# Patient Record
Sex: Female | Born: 1985 | Race: Black or African American | Hispanic: No | Marital: Single | Smoking: Never smoker
Health system: Southern US, Community
[De-identification: ages and names within clinical notes are randomized; demographics above are authoritative.]

## PROBLEM LIST (undated history)

## (undated) DIAGNOSIS — G932 Benign intracranial hypertension: Secondary | ICD-10-CM

## (undated) DIAGNOSIS — M79671 Pain in right foot: Secondary | ICD-10-CM

## (undated) DIAGNOSIS — H4710 Unspecified papilledema: Secondary | ICD-10-CM

## (undated) HISTORY — DX: Benign intracranial hypertension: G93.2

## (undated) HISTORY — DX: Unspecified papilledema: H47.10

## (undated) HISTORY — DX: Pain in right foot: M79.671

## (undated) HISTORY — PX: WISDOM TOOTH EXTRACTION: SHX21

## (undated) HISTORY — PX: FOOT SURGERY: SHX648

---
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
## 2009-02-01 ENCOUNTER — Emergency Department (HOSPITAL_COMMUNITY): Admission: EM | Admit: 2009-02-01 | Discharge: 2009-02-01 | Payer: Self-pay | Admitting: Emergency Medicine

## 2009-10-19 ENCOUNTER — Emergency Department (HOSPITAL_COMMUNITY): Admission: EM | Admit: 2009-10-19 | Discharge: 2009-10-19 | Payer: Self-pay | Admitting: Family Medicine

## 2009-11-01 ENCOUNTER — Emergency Department (HOSPITAL_COMMUNITY): Admission: EM | Admit: 2009-11-01 | Discharge: 2009-11-01 | Payer: Self-pay | Admitting: Family Medicine

## 2009-12-17 ENCOUNTER — Emergency Department (HOSPITAL_COMMUNITY): Admission: EM | Admit: 2009-12-17 | Discharge: 2009-12-17 | Payer: Self-pay | Admitting: Family Medicine

## 2010-05-22 ENCOUNTER — Emergency Department (HOSPITAL_COMMUNITY): Admission: EM | Admit: 2010-05-22 | Discharge: 2009-10-23 | Payer: Self-pay | Admitting: Emergency Medicine

## 2010-09-20 LAB — URINE CULTURE: Colony Count: 3000

## 2010-09-20 LAB — POCT I-STAT, CHEM 8
BUN: 14 mg/dL (ref 6–23)
Chloride: 104 mEq/L (ref 96–112)
Creatinine, Ser: 0.8 mg/dL (ref 0.4–1.2)
Sodium: 140 mEq/L (ref 135–145)
TCO2: 24 mmol/L (ref 0–100)

## 2010-09-20 LAB — CBC
HCT: 41.7 % (ref 36.0–46.0)
Hemoglobin: 14.4 g/dL (ref 12.0–15.0)
Platelets: 235 10*3/uL (ref 150–400)
RDW: 13.2 % (ref 11.5–15.5)
WBC: 6.2 10*3/uL (ref 4.0–10.5)

## 2010-09-20 LAB — URINALYSIS, ROUTINE W REFLEX MICROSCOPIC
Bilirubin Urine: NEGATIVE
Hgb urine dipstick: NEGATIVE
Protein, ur: NEGATIVE mg/dL
Urobilinogen, UA: 1 mg/dL (ref 0.0–1.0)

## 2010-09-20 LAB — DIFFERENTIAL
Basophils Absolute: 0 10*3/uL (ref 0.0–0.1)
Eosinophils Relative: 0 % (ref 0–5)
Lymphocytes Relative: 8 % — ABNORMAL LOW (ref 12–46)
Lymphs Abs: 0.5 10*3/uL — ABNORMAL LOW (ref 0.7–4.0)
Neutro Abs: 5.4 10*3/uL (ref 1.7–7.7)

## 2010-09-20 LAB — POCT PREGNANCY, URINE: Preg Test, Ur: NEGATIVE

## 2011-03-30 ENCOUNTER — Emergency Department (HOSPITAL_COMMUNITY)
Admission: EM | Admit: 2011-03-30 | Discharge: 2011-03-30 | Disposition: A | Discharge status: Home / Self Care | Payer: Self-pay | Attending: Emergency Medicine | Admitting: Emergency Medicine

## 2011-03-30 DIAGNOSIS — R5381 Other malaise: Secondary | ICD-10-CM | POA: Insufficient documentation

## 2011-03-30 DIAGNOSIS — R42 Dizziness and giddiness: Secondary | ICD-10-CM | POA: Insufficient documentation

## 2011-03-30 DIAGNOSIS — R059 Cough, unspecified: Secondary | ICD-10-CM | POA: Insufficient documentation

## 2011-03-30 DIAGNOSIS — R05 Cough: Secondary | ICD-10-CM | POA: Insufficient documentation

## 2011-03-30 DIAGNOSIS — J069 Acute upper respiratory infection, unspecified: Secondary | ICD-10-CM | POA: Insufficient documentation

## 2011-03-30 DIAGNOSIS — R079 Chest pain, unspecified: Secondary | ICD-10-CM | POA: Insufficient documentation

## 2011-03-30 DIAGNOSIS — R071 Chest pain on breathing: Secondary | ICD-10-CM | POA: Insufficient documentation

## 2014-04-13 ENCOUNTER — Ambulatory Visit (INDEPENDENT_AMBULATORY_CARE_PROVIDER_SITE_OTHER): Payer: BC Managed Care – PPO

## 2014-04-13 ENCOUNTER — Encounter: Payer: Self-pay | Admitting: Podiatry

## 2014-04-13 ENCOUNTER — Ambulatory Visit (INDEPENDENT_AMBULATORY_CARE_PROVIDER_SITE_OTHER): Payer: BC Managed Care – PPO | Admitting: Podiatry

## 2014-04-13 VITALS — BP 123/76 | HR 88 | Resp 16

## 2014-04-13 DIAGNOSIS — M2142 Flat foot [pes planus] (acquired), left foot: Secondary | ICD-10-CM

## 2014-04-13 DIAGNOSIS — M722 Plantar fascial fibromatosis: Secondary | ICD-10-CM

## 2014-04-13 DIAGNOSIS — M2141 Flat foot [pes planus] (acquired), right foot: Secondary | ICD-10-CM

## 2014-04-13 NOTE — Progress Notes (Signed)
   Subjective:    Patient ID: Lisa Stafford, female    DOB: April 30, 1986, 28 y.o.   MRN: [id number]  HPI Comments: "I have these knots on the bottom"  Lisa Stafford, 28 year old female, presents to the office today with complaints of bilateral arch pain and painful knot on the bottom of her feet 1 year. She states over the last several months to 1 year did not increase in size. She states the pain is worse with pressure and ambulation. She's had no prior treatment. She also states that she has pain in her heels on the bottom particularly in the morning or after periods of activity. She denies any recent trauma or injury to the area. No other complaints at this time.     Review of Systems  Musculoskeletal: Positive for gait problem and myalgias.  All other systems reviewed and are negative.      Objective:   Physical Exam AAO x3, NAD DP/PT pulses palpable bilaterally, CRT less than 3 seconds Protective sensation intact with Simms Weinstein monofilament, vibratory sensation intact, Achilles tendon reflex intact Bilateral tenderness to the plantar medial tubercle of the calcaneus at the insertion of the plantar fascia. There is no pain with lateral compression of the calcaneus or with vibratory sensation. No pain along the posterior aspect of the calcaneus or along the course of the Achilles tendon. There is tenderness to palpation over areas of firm nodules within the plantar medial arch of the foot. On the right foot there are 2 lesions identified on the left foot there is one lesion identified. All are firm and appeared to be mobile and appeared to be within the plantar fascia. The overlying skin is intact without any changes. There is no edema, erythema, increased warmth over the area. There is no pinpoint bony tenderness or pain with vibratory sensation. Decrease in medial arch height bilaterally with bilateral equinus. MMT 5/5, ROM WNL No open lesions or pre-ulcerative lesions. No calf  pain, swelling, warmth, erythema       Assessment & Plan:  28 year old female with bilateral plantar fasciitis, bilateral plantar fibromas. -X-rays were obtained and reviewed with the patient. -Conservative versus surgical treatment discussed including alternatives, risks, complications. At this time the patient states that she cannot have any surgical intervention as she is going to be starting a new job in the next couple weeks. -Dispensed offloading pad to help take the pressure off of the plantar fibromas. Discussed with her that she would likely benefit from custom orthotics. She states that she will call make an appointment to get custom orthotics in the future. -For plantar fasciitis patient wishes to hold off on any kind of steroid injection at this time as well as for the plantar fibromas. Discussed with her stretching as well as icing exercises. Also discussed that she would likely benefit from a custom orthotic. Discussed proper shoe gear. -Follow up in 4 weeks. Call the office for any questions, concerns, change in symptoms. In the meantime call the office with any questions, concerns, change in symptoms.

## 2014-05-09 ENCOUNTER — Ambulatory Visit: Payer: BC Managed Care – PPO | Admitting: Podiatry

## 2014-05-26 ENCOUNTER — Ambulatory Visit (INDEPENDENT_AMBULATORY_CARE_PROVIDER_SITE_OTHER): Payer: BC Managed Care – PPO | Admitting: Podiatry

## 2014-05-26 ENCOUNTER — Encounter: Payer: Self-pay | Admitting: Podiatry

## 2014-05-26 VITALS — BP 124/72 | HR 80 | Resp 16

## 2014-05-26 DIAGNOSIS — M722 Plantar fascial fibromatosis: Secondary | ICD-10-CM

## 2014-05-26 MED ORDER — MELOXICAM 7.5 MG PO TABS: 7.5000 mg | ORAL_TABLET | Freq: Every day | ORAL | Status: DC

## 2014-05-26 NOTE — Patient Instructions (Signed)
Follow-up after MRI

## 2014-05-28 NOTE — Progress Notes (Signed)
Patient ID: Lisa Stafford, female   DOB: [birth year], 28 y.o.   MRN: [id number]   Subjective: 28 year old female returns the office they for follow-up evaluation of bilateral plantar fibromas. She states that since last appointment the left side has become more symptomatic. She believes the left has become more problematic as the right side masses or larger she has started more weight on the left foot. At this time the patient states that she would like to proceed with surgical intervention. She is attended shoe marking modifications as well as offloading without any resolution of symptoms. No other complaints at this time in no acute changes since last appointment. Denies any systemic complaints such as fevers, chills, nausea, vomiting.  Objective: AAO x3, NAD DP/PT pulses palpable bilaterally, CRT less than 3 seconds Protective sensation intact with Simms Weinstein monofilament, vibratory sensation intact, Achilles tendon reflex intact On the right foot there are 2 large firm plantar soft tissue masses which appear to be within the plantar fascia ligament. There is mild discomfort directly overlying the lesions. Overlying skin is intact without any changes or any open lesions. There is no evidence of pinpoint bony tenderness or pain with vibratory sensation. No overlying edema, erythema, increased warmth. On the left foot there is 1 firm soft tissue mass within the medial band of the plantar fascia. However there is discomfort just distal to this mass however no radicular soft tissue mass is identified at the area of tenderness. There is mild overlying edema. No overlying skin changes are open lesions. No pinpoint bony tenderness or pain with vibratory sensation. No open lesions or pre-ulcerative lesions. Decrease in medial arch height upon weightbearing. 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 -At this time on the left side there is pain distal to the area soft tissue mass. Patient likely is forming another plantar fibroma in this area. Due to the pain without any palpable lesion will obtain MRI at the foot prior to surgery to further evaluate if there are multiple masses or if there is any other pathology. We'll also obtain MRI of the right foot due to the large soft tissue masses as she desires surgery on the right side as well. Discussed the patient that I would only perform surgery one side at a time due to the need to remain nonweightbearing postoperatively. -Follow-up after the MRI for surgical consultation to discuss the surgery in more detail.  -In the meantime, call the office any questions, concerns, change in symptoms.

## 2014-06-01 ENCOUNTER — Telehealth: Payer: Self-pay | Admitting: *Deleted

## 2014-06-01 NOTE — Telephone Encounter (Signed)
Pt need prior authorization for MRI of left foot 73720 and right foot 73720 before 06/04/2014.

## 2014-06-01 NOTE — Telephone Encounter (Signed)
I called and spoke to [place] at [facility].  I answered all her clinical questions.  MRI was authorized from 06/01/2014 to 07/30/2014.  Authorization number is [id number].  I called and left Chip BoerVicki a message about the authorization.  I received a call from Chip BoerVicki and was informed that patient needed authorization for both feet.  [facility] called [facility] and [facility]  I received authorization of the MRI for the left foot.  The Authorization number is [id number].  She said the authorization is good for 60 days.  I called and informed Chip BoerVicki at [facility].

## 2014-06-03 ENCOUNTER — Other Ambulatory Visit: Payer: Self-pay

## 2014-06-04 ENCOUNTER — Other Ambulatory Visit: Payer: Self-pay

## 2014-06-06 ENCOUNTER — Ambulatory Visit
Admission: RE | Admit: 2014-06-06 | Discharge: 2014-06-06 | Disposition: A | Discharge status: Home / Self Care | Payer: BC Managed Care – PPO | Source: Ambulatory Visit | Attending: Podiatry | Admitting: Podiatry

## 2014-06-06 DIAGNOSIS — M722 Plantar fascial fibromatosis: Secondary | ICD-10-CM

## 2014-06-06 MED ORDER — GADOBENATE DIMEGLUMINE 529 MG/ML IV SOLN
10.0000 mL | Freq: Once | INTRAVENOUS | Status: AC | PRN
  Administered 2014-06-06: 10 mL via INTRAVENOUS

## 2014-06-22 ENCOUNTER — Encounter: Payer: Self-pay | Admitting: Podiatry

## 2014-06-22 ENCOUNTER — Ambulatory Visit (INDEPENDENT_AMBULATORY_CARE_PROVIDER_SITE_OTHER): Payer: BLUE CROSS/BLUE SHIELD | Admitting: Podiatry

## 2014-06-22 VITALS — BP 119/70 | HR 77 | Resp 12

## 2014-06-22 DIAGNOSIS — M722 Plantar fascial fibromatosis: Secondary | ICD-10-CM

## 2014-06-22 NOTE — Patient Instructions (Signed)
Pre-Operative Instructions  Congratulations, you have decided to take an important step to improving your quality of life.  You can be assured that the doctors of [facility] will be with you every step of the way.  1. Plan to be at the surgery center/hospital at least 1 (one) hour prior to your scheduled time unless otherwise directed by the surgical center/hospital staff.  You must have a responsible adult accompany you, remain during the surgery and drive you home.  Make sure you have directions to the surgical center/hospital and know how to get there on time. 2. For hospital based surgery you will need to obtain a history and physical form from your family physician within 1 month prior to the date of surgery- we will give you a form for you primary physician.  3. We make every effort to accommodate the date you request for surgery.  There are however, times where surgery dates or times have to be moved.  We will contact you as soon as possible if a change in schedule is required.   4. No Aspirin/Ibuprofen for one week before surgery.  If you are on aspirin, any non-steroidal anti-inflammatory medications (Mobic, Aleve, Ibuprofen) you should stop taking it 7 days prior to your surgery.  You make take Tylenol  For pain prior to surgery.  5. Medications- If you are taking daily heart and blood pressure medications, seizure, reflux, allergy, asthma, anxiety, pain or diabetes medications, make sure the surgery center/hospital is aware before the day of surgery so they may notify you which medications to take or avoid the day of surgery. 6. No food or drink after midnight the night before surgery unless directed otherwise by surgical center/hospital staff. 7. No alcoholic beverages 24 hours prior to surgery.  No smoking 24 hours prior to or 24 hours after surgery. 8. Wear loose pants or shorts- loose enough to fit over bandages, boots, and casts. 9. No slip on shoes, sneakers are best. 10. Bring  your boot with you to the surgery center/hospital.  Also bring crutches or a walker if your physician has prescribed it for you.  If you do not have this equipment, it will be provided for you after surgery. 11. If you have not been contracted by the surgery center/hospital by the day before your surgery, call to confirm the date and time of your surgery. 12. Leave-time from work may vary depending on the type of surgery you have.  Appropriate arrangements should be made prior to surgery with your employer. 13. Prescriptions will be provided immediately following surgery by your doctor.  Have these filled as soon as possible after surgery and take the medication as directed. 14. Remove nail polish on the operative foot. 15. Wash the night before surgery.  The night before surgery wash the foot and leg well with the antibacterial soap provided and water paying special attention to beneath the toenails and in between the toes.  Rinse thoroughly with water and dry well with a towel.  Perform this wash unless told not to do so by your physician.  Enclosed: 1 Ice pack (please put in freezer the night before surgery)   1 Hibiclens skin cleaner   Pre-op Instructions  If you have any questions regarding the instructions, do not hesitate to call our office.  [place]: [address] [place], [place] [address] [phone]  [place]: [address] [place], [place] [address] [phone]  [place]: [address]  [place], [place] [address] [phone]  Dr. Richard   Tuchman DPM, Dr. Norman Regal DPM Dr. Richard Sikora DPM, Dr. M. Todd Hyatt DPM, Dr. Kathryn Egerton DPM, Dr. Matthew Wagoner DPM 

## 2014-06-25 NOTE — Progress Notes (Signed)
Patient ID: Lisa Stafford, female   DOB: 1985/12/03, 29 y.o.   MRN: [id number]  Subjective: 29 year old female returns the office today to discuss MRI results due to soft tissue masses on the bottom of her feet. Since last appointment she has been stuck continue with anti-inflammatory medications which seem to help alleviate a lot of her symptoms. She continues to have mild intermittent discomfort particularly to the right foot. She states that she intermittently gets sharp shooting pains around the area on the bottom of her feet. She states the left foot pain is significantly improved. Denies any acute changes his last appointment and no other complaints at this time. Denies any systemic complaints such as fevers, chills, nausea, vomiting.  Objective: AAO x3, NAD DP/PT pulses palpable bilaterally, CRT less than 3 seconds Protective sensation intact with Simms Weinstein monofilament, vibratory sensation intact, Achilles tendon reflex intact On the right foot large plantar soft tissue mass appears within the medial band of the plantar fascia. There is mild discomfort at the overlying this lesion. There is no overlying skin change or discoloration. On the left foot there is a smaller soft tissue mass in the medial band of the plantar fascia. Again, there is no overlying skin changes discoloration. No areas of pinpoint bony tenderness or pain with vibratory sensation bilateral lower extremities. MMT 5/5, ROM WNL  No open lesions or pre-ulcer lesions identified.  Assessment: 29 year old female bilateral plantar fibromas right greater than left.  Plan: -Conservative and surgical options were discussed the patient including alternatives, risks, complications. -At this time the patient has left proceed with excision of soft tissue mass on the right foot. I discussed with her the incision placement as well as the surgery and the postoperative course discussed the best of my ability. Discussed the patient  that she will need to remain nonweightbearing for a minimum of 2-3 weeks until the suture is removed and the incision is healed. I also discussed at great length with the patient risks of the surgery including, but not limited to, infection, bleeding, pain, swelling, need for further surgery, delayed/nonhealing, painful/ugly scar, numbness, sensation changes, reoccurrence, blood clots including DVT/PE, digital contractures. The patient understands these risks. The surgical consent was reviewed with the patient all 3 pain is worse. No promises or guarantees were given a back on the procedure and all questions were answered to the best of my ability. A second opinion was offered the patient however she declined. -[facility]. She will follow-up after surgery however encouraged to call the office before that if there is any questions, concerns, change in symptoms.

## 2014-07-09 ENCOUNTER — Encounter: Payer: Self-pay | Admitting: Podiatry

## 2014-07-09 DIAGNOSIS — M722 Plantar fascial fibromatosis: Secondary | ICD-10-CM

## 2014-07-10 ENCOUNTER — Telehealth: Payer: Self-pay

## 2014-07-10 NOTE — Telephone Encounter (Signed)
Spoke with pt regarding post operative status. She stated that she was still having some issues with pain, She admits to taking pain medications as scheduled and icing and elevating foot, so I asdvised her to loosen the boot and ace wrap on her foot to help relieve some pressure and continue taking pain medications. Denied any fever,chills, n/v. Pt is to call if pain persists

## 2014-07-12 NOTE — Progress Notes (Signed)
DOS 07/09/2014 Right foot Excision benign lesion 2.0 cm performed by Dr. Ardelle AntonWagoner.

## 2014-07-17 ENCOUNTER — Telehealth: Payer: Self-pay | Admitting: *Deleted

## 2014-07-17 MED ORDER — OXYCODONE-ACETAMINOPHEN 5-325 MG PO TABS: 1.0000 | ORAL_TABLET | ORAL | Status: DC | PRN

## 2014-07-17 NOTE — Telephone Encounter (Signed)
That is ok to refill. She has an appointment tomorrow (Wed). I can give it to her then if she wants to wait or she can come in to get it. Thanks.

## 2014-07-17 NOTE — Telephone Encounter (Signed)
Pt requested refill of percocet.  Dr Bary CastillaWagoner okayed to refill as before.

## 2014-07-18 ENCOUNTER — Ambulatory Visit (INDEPENDENT_AMBULATORY_CARE_PROVIDER_SITE_OTHER): Payer: BLUE CROSS/BLUE SHIELD | Admitting: Podiatry

## 2014-07-18 ENCOUNTER — Encounter: Payer: Self-pay | Admitting: Podiatry

## 2014-07-18 DIAGNOSIS — Z9889 Other specified postprocedural states: Secondary | ICD-10-CM

## 2014-07-18 DIAGNOSIS — M722 Plantar fascial fibromatosis: Secondary | ICD-10-CM

## 2014-07-18 NOTE — Progress Notes (Signed)
Patient ID: Lisa Stafford, female   DOB: [birth year], 29 y.o.   MRN: [id number]  DOS: 07/09/14  Subjective: 28 roll female presents the office today postop visit #1 status post right plantar fibroma excision. She states that she has tried to remain nonweightbearing as much as possible on that she does not like the crutches. She denies any systemic complaints as fevers, chills, nausea, vomiting. Denies any calf pain, chest pain, shortness of breath. She has been a short course of antibiotics. She didn't pick up a new prescription for Percocet yesterday and her pain is controlled at this time.  Objective: AAO 3, NAD DP/PT pulses palpable, CRT less than 3 seconds Protective sensation intact with Simms was monofilament Incision on the plantar aspect of the right foot is well coapted with sutures intact without any evidence of dehiscence. There is no swelling erythema, ascending cellulitis, drainage or any other clinical signs of infection. There is no significant tenderness overlying the surgical site. No other areas of tenderness to bilateral lower extremity. No pain with calf compression, swelling, warmth, erythema. No other open lesions or pre-ulcer lesions identified.  Assessment: 29 year old female 1 week status post right inter-fibroma excision, doing well  Plan: -Treatment options were discussed with the patient including alternatives, risks, complications. -Antibiotic ointment was applied over the incision followed by dry sterile dressing. Recommend her to continue with the Cam Walker at all times and to remain nonweightbearing. I did offer her knee scooter walker however she declines and wishes to continue with the crutches. -Continue ice and elevation. -Continue to monitor for any signs or symptoms of infection and DVT/PE. If any are to occur to call the office or go directly to the emergency room. -Follow-up in 2 weeks for suture removal. In the meantime, cursor call the office with  any questions, concerns, change in symptoms.

## 2014-07-18 NOTE — Patient Instructions (Signed)
Continue non-weightbearing to your surgical foot.  Monitor for any signs/symptoms of infection. Call the office immediately if any occur or go directly to the emergency room. Call with any questions/concerns.

## 2014-07-20 DIAGNOSIS — M722 Plantar fascial fibromatosis: Secondary | ICD-10-CM

## 2014-08-01 ENCOUNTER — Encounter: Payer: Self-pay | Admitting: Podiatry

## 2014-08-01 ENCOUNTER — Ambulatory Visit (INDEPENDENT_AMBULATORY_CARE_PROVIDER_SITE_OTHER): Payer: BLUE CROSS/BLUE SHIELD | Admitting: Podiatry

## 2014-08-01 DIAGNOSIS — Z9889 Other specified postprocedural states: Secondary | ICD-10-CM

## 2014-08-01 NOTE — Patient Instructions (Signed)
Monitor for any signs/symptoms of infection. Call the office immediately if any occur or go directly to the emergency room. Call with any questions/concerns.  

## 2014-08-02 NOTE — Progress Notes (Addendum)
Patient ID: Lisa Stafford, female   DOB: Oct 16, 1985, 29 y.o.   MRN: [id number]  Subjective: 29 year old female presents the office today postop visit #2 status post right plantar fibroma excision. She states that she is doing well overall her pain is controlled. She's been continuing nonweightbearing in the CAM boot. She denies any systemic complaints as fevers, chills, nausea, vomiting. Denies any calf pain, chest pain, shortness of breath. No other complaints at this time. No acute changes since last appointment.  Objective: AAO 3, NAD DP/PT pulses palpable, CRT less than 3 seconds Protective sensation intact with Simms Weinstein monofilament, Achilles tendon reflex intact. Incision on the plantar aspect of the right foot is well coapted without any evidence of dehiscence and sutures are intact. There is no overlying edema, erythema, increased warmth. No surrounding erythema or before meals cellulitis. There is no drainage or purulence. There is no malodor. No clinical signs of infection. Mild tenderness directly overlying the procedure site. No other areas of tenderness to bilateral lower extremities. No other open lesions or pre-ulcerative lesions are identified. No pain with calf compression, swelling, warmth, erythema.  Assessment: 29 year old female 3 weeks status post right plantar fibroma excision, doing well.  Plan: -Treatment options were discussed with the patient including alternatives, risks, complications. -At today's appointment the sutures were removed without complications. -Discussed with her she can start to shower and get the area wet however to keep the area clean and dry afterwards and to apply small amount of antibiotic ointment and a Band-Aid over the incision. Monitor the incision for any openings or breakdown and if there is to hold off on showering and call the office immediately.  -Can weight-bear to heal and surgical shoe.  -Monitor for any signs or symptoms of  infection and directed to call the office immediately should any occur or go to the emergency room.  -Follow-up in 2 weeks or sooner if any problems are to arise. In the meantime, encouraged to call the office with any questions, concerns, change in symptoms.   *This clinic note was [facility] at [phone] on 08/02/14 per patients request.

## 2014-08-08 ENCOUNTER — Other Ambulatory Visit: Payer: Self-pay | Admitting: Podiatry

## 2014-08-08 MED ORDER — HYDROCODONE-ACETAMINOPHEN 5-325 MG PO TABS: 1.0000 | ORAL_TABLET | Freq: Four times a day (QID) | ORAL | Status: DC | PRN

## 2014-08-15 ENCOUNTER — Ambulatory Visit (INDEPENDENT_AMBULATORY_CARE_PROVIDER_SITE_OTHER): Payer: BLUE CROSS/BLUE SHIELD | Admitting: Podiatry

## 2014-08-15 ENCOUNTER — Encounter: Payer: Self-pay | Admitting: Podiatry

## 2014-08-15 VITALS — BP 118/79 | HR 99 | Resp 18

## 2014-08-15 DIAGNOSIS — Z9889 Other specified postprocedural states: Secondary | ICD-10-CM

## 2014-08-16 NOTE — Progress Notes (Signed)
Patient ID: Lisa Stafford, female   DOB: Jul 13, 1985, 29 y.o.   MRN: [id number]  Subjective: 29 year old female presents the office they postop visit #3 status post right foot plantar fibroma excision. She states that overall she is doing well and her pain is controlled. She does that time she cannot sleep at night however she is does not take any pain medication on a regular basis. She states that she can feel her foot swell intermittently which causes discomfort. She denies any redness to her foot. She states the incision has had no problems and she denies any drainage or any redness from around the incision. Denies any systemic complaints as fevers, chills, nausea, vomiting. Denies any calf pain, chest pain, shortness of breath. No other complaints at this time in no acute changes since last appointment.  Objective: AAO 3, NAD DP/PT pulses palpable, CRT less than 3 seconds Protective sensation intact with Simms Weinstein monofilament, Achilles tendon reflex intact. Incision along the plantar aspect of the right foot is well coapted without any evidence of dehiscence. There is no drainage, purulence, surrounding erythema, ascending cellulitis, malodor, or any other clinical signs of infection. There is trace edema overlying the surgical site. There is mild tenderness directly upon palpation of the surgical site. No other areas of tenderness to bilateral lower extremity is. No overlying edema, erythema, increase in warmth. MMT 5/5, ROM WNL No other open lesions or pre-ulcerative lesions identified bilaterally. No pain with calf compression, swelling, warmth, erythema.  Assessment: 29 year old female status post right foot excision plantar fibroma.  Plan: -Treatment options were discussed with the patient including alternatives, risks, competitions. -Compression anklet was dispensed the patient to help control swelling. -Continue weightbearing as tolerated. -Discussed that she can start to use  a small amount of moisturizer over the incision to help with the scar. There is any problem is with the incision or any opening to hold off on moisturizer and apply small amount and about appointment and to call the office immediately. -Discussed the patient that she can take pain medication if needed to help with the discomfort order for her to sleep worse to take an over-the-counter anti-inflammatory as well. It does not appear that the pain is causing her not to sleep as she has not required any pain medication. Recommended her to also follow with her primary care physician. -Continue to monitor for any signs or symptoms of infection and directed to call the office immediately should any occur or go to the ER. -Follow-up in 3 weeks or sooner if any problems are to arise. In the meantime, encouraged to call the office with any questions, concerns, change in symptoms.

## 2014-08-20 ENCOUNTER — Ambulatory Visit: Payer: BLUE CROSS/BLUE SHIELD

## 2014-08-20 ENCOUNTER — Telehealth: Payer: Self-pay

## 2014-08-20 NOTE — Telephone Encounter (Signed)
Spoke with pt regarding incision status. She stated that her incision looked slightly open when she got out of the shower but denies drainage and redness and pain. Advisede to let incision air dry after showers and watch for s/s of infection

## 2014-08-29 ENCOUNTER — Emergency Department (HOSPITAL_BASED_OUTPATIENT_CLINIC_OR_DEPARTMENT_OTHER)
Admission: EM | Admit: 2014-08-29 | Discharge: 2014-08-29 | Disposition: A | Discharge status: Home / Self Care | Payer: BLUE CROSS/BLUE SHIELD | Attending: Emergency Medicine | Admitting: Emergency Medicine

## 2014-08-29 ENCOUNTER — Encounter (HOSPITAL_BASED_OUTPATIENT_CLINIC_OR_DEPARTMENT_OTHER): Payer: Self-pay

## 2014-08-29 ENCOUNTER — Emergency Department (HOSPITAL_COMMUNITY): Payer: BLUE CROSS/BLUE SHIELD

## 2014-08-29 DIAGNOSIS — H05229 Edema of unspecified orbit: Secondary | ICD-10-CM | POA: Diagnosis present

## 2014-08-29 DIAGNOSIS — R519 Headache, unspecified: Secondary | ICD-10-CM

## 2014-08-29 DIAGNOSIS — H4710 Unspecified papilledema: Secondary | ICD-10-CM

## 2014-08-29 DIAGNOSIS — R51 Headache: Secondary | ICD-10-CM | POA: Insufficient documentation

## 2014-08-29 DIAGNOSIS — G932 Benign intracranial hypertension: Secondary | ICD-10-CM

## 2014-08-29 LAB — BASIC METABOLIC PANEL
Anion gap: 7 (ref 5–15)
BUN: 12 mg/dL (ref 6–23)
CALCIUM: 9.1 mg/dL (ref 8.4–10.5)
CO2: 27 mmol/L (ref 19–32)
CREATININE: 0.86 mg/dL (ref 0.50–1.10)
Chloride: 105 mmol/L (ref 96–112)
GFR calc Af Amer: >90 mL/min (ref >90)
GFR calc non Af Amer: >90 mL/min (ref >90)
GLUCOSE: 80 mg/dL (ref 70–99)
Potassium: 3.8 mmol/L (ref 3.5–5.1)
Sodium: 139 mmol/L (ref 135–145)

## 2014-08-29 LAB — CBC WITH DIFFERENTIAL/PLATELET
BASOS ABS: 0 10*3/uL (ref 0.0–0.1)
Basophils Relative: 0 % (ref 0–1)
Eosinophils Absolute: 0 10*3/uL (ref 0.0–0.7)
Eosinophils Relative: 1 % (ref 0–5)
HEMATOCRIT: 40.5 % (ref 36.0–46.0)
Hemoglobin: 13.3 g/dL (ref 12.0–15.0)
LYMPHS PCT: 48 % — AB (ref 12–46)
Lymphs Abs: 2.1 10*3/uL (ref 0.7–4.0)
MCH: 27.8 pg (ref 26.0–34.0)
MCHC: 32.8 g/dL (ref 30.0–36.0)
MCV: 84.7 fL (ref 78.0–100.0)
MONO ABS: 0.3 10*3/uL (ref 0.1–1.0)
Monocytes Relative: 6 % (ref 3–12)
NEUTROS ABS: 2 10*3/uL (ref 1.7–7.7)
NEUTROS PCT: 45 % (ref 43–77)
PLATELETS: 262 10*3/uL (ref 150–400)
RBC: 4.78 MIL/uL (ref 3.87–5.11)
RDW: 13.3 % (ref 11.5–15.5)
WBC: 4.4 10*3/uL (ref 4.0–10.5)

## 2014-08-29 LAB — PROTIME-INR
INR: 0.96 (ref 0.00–1.49)
Prothrombin Time: 12.9 seconds (ref 11.6–15.2)

## 2014-08-29 MED ORDER — GADOBENATE DIMEGLUMINE 529 MG/ML IV SOLN
15.0000 mL | Freq: Once | INTRAVENOUS | Status: AC | PRN
  Administered 2014-08-29: 15 mL via INTRAVENOUS

## 2014-08-29 MED ORDER — ACETAZOLAMIDE 250 MG PO TABS: 250.0000 mg | ORAL_TABLET | Freq: Two times a day (BID) | ORAL | Status: DC

## 2014-08-29 NOTE — ED Notes (Signed)
Pt alo x 4 on d/c with friend.

## 2014-08-29 NOTE — ED Provider Notes (Signed)
CSN: [id number]     Arrival date & time 08/29/14  1653 History   First MD Initiated Contact with Patient 08/29/14 1713     Chief Complaint  Patient presents with  . Headache     (Consider location/radiation/quality/duration/timing/severity/associated sxs/prior Treatment) HPI Comments: 29 year old female presenting from her optometrist office after being told she had left optic nerve swelling. She was at her eye doctor for her routine visit, and discussed with him that she's been experiencing intermittent headaches, behind her left eye over the past 3 months. Headaches are not severe, coming and going at random. She had surgery on her foot 2 months ago and has had narcotic pain medication for that pain, and states her foot pain is way worsen her headaches have been. She states intermittently she gets blurred vision in both of her eyes, but this is normal for her. Denies nausea, vomiting, fever, chills, neck pain or any other symptoms. She was advised to come to the emergency department for an MRI and lumbar puncture.  Patient is a 29 y.o. female presenting with headaches. The history is provided by the patient.  Headache Associated symptoms: eye pain (behind left eye)     History reviewed. No pertinent past medical history. History reviewed. No pertinent past surgical history. History reviewed. No pertinent family history. History  Substance Use Topics  . Smoking status: Never Smoker   . Smokeless tobacco: Not on file  . Alcohol Use: Yes   OB History    No data available     Review of Systems  Eyes: Positive for pain (behind left eye).  Neurological: Positive for headaches.  All other systems reviewed and are negative.     Allergies  Review of patient's allergies indicates no known allergies.  Home Medications   Prior to Admission medications   Medication Sig Start Date End Date Taking? Authorizing Provider  HYDROcodone-acetaminophen (NORCO/VICODIN) 5-325 MG per tablet  Take 1 tablet by mouth every 6 (six) hours as needed for moderate pain. 08/08/14  Yes Vivi Barrack, DPM  cephALEXin (KEFLEX) 500 MG capsule Take 500 mg by mouth 3 (three) times daily.    Vivi Barrack, DPM  meloxicam (MOBIC) 7.5 MG tablet Take 1 tablet (7.5 mg total) by mouth daily. 05/26/14   Vivi Barrack, DPM  oxyCODONE-acetaminophen (PERCOCET/ROXICET) 5-325 MG per tablet Take 1 tablet by mouth every 4 (four) hours as needed for severe pain. 07/17/14   Vivi Barrack, DPM  promethazine (PHENERGAN) 12.5 MG tablet Take 12.5 mg by mouth every 6 (six) hours as needed for nausea or vomiting.    Vivi Barrack, DPM   BP 132/71 mmHg  Pulse 77  Temp(Src) 98.4 F (36.9 C) (Oral)  Resp 18  Ht  (1.626 m)  Wt 222 lb (100.699 kg)  BMI 38.09 kg/m2  SpO2 100%  LMP 07/17/2014 Physical Exam  Constitutional: She is oriented to person, place, and time. She appears well-developed and well-nourished. No distress.  HENT:  Head: Normocephalic and atraumatic.  Mouth/Throat: Oropharynx is clear and moist.  Eyes: Conjunctivae and EOM are normal. Pupils are equal, round, and reactive to light.  I am unable to visualize optic nerve on funduscopic examination.  Neck: Normal range of motion. Neck supple.  Cardiovascular: Normal rate, regular rhythm and normal heart sounds.   Pulmonary/Chest: Effort normal and breath sounds normal. No respiratory distress.  Musculoskeletal: Normal range of motion. She exhibits no edema.  Neurological: She is alert and oriented to person, place, and  time. No sensory deficit.  Skin: Skin is warm and dry.  Psychiatric: She has a normal mood and affect. Her behavior is normal.  Nursing note and vitals reviewed.   ED Course  Procedures (including critical care time) Labs Review Labs Reviewed - No data to display  Imaging Review No results found.   EKG Interpretation None      MDM   Final diagnoses:  Optic nerve edema  Left-sided headache    Patient presenting for MRI and lumbar puncture after being sent by her optometrist. I discussed this with the patient, and she is refusing to have a lumbar puncture done. Unfortunately, MRI is not available at this facility at this time. [facility] to have an MRI done there. I spoke with Dr. Littie DeedsGentry who accepts patient in transfer and will wait for her arrival to the emergency department. Stable for transfer.  Discussed with attending Dr. Gwendolyn GrantWalden who agrees with plan of care.    Lisa SpeedRobyn M Ainsleigh Kakos, PA-C 08/29/14 1804  Lisa MochaBlair Walden, MD 08/29/14 2258

## 2014-08-29 NOTE — ED Notes (Signed)
Pt back from MRI 

## 2014-08-29 NOTE — ED Provider Notes (Signed)
CSN: [id number]     Arrival date & time 08/29/14  1653 History   First MD Initiated Contact with Patient 08/29/14 1713     Chief Complaint  Patient presents with  . Headache     (Consider location/radiation/quality/duration/timing/severity/associated sxs/prior Treatment) Patient is a 29 y.o. female presenting with headaches. The history is provided by the patient.  Headache Pain location:  Frontal Quality:  Dull Radiates to:  Eyes Onset quality:  Gradual Duration:  10 weeks Timing:  Intermittent Progression:  Waxing and waning Chronicity:  New Similar to prior headaches: no   Context comment:  Worse at night and in the mornings Relieved by:  None tried Worsened by:  Nothing Ineffective treatments:  None tried Associated symptoms: blurred vision, facial pain and visual change   Associated symptoms: no fever, no focal weakness, no loss of balance, no neck pain, no photophobia, no seizures and no weakness     History reviewed. No pertinent past medical history. History reviewed. No pertinent past surgical history. History reviewed. No pertinent family history. History  Substance Use Topics  . Smoking status: Never Smoker   . Smokeless tobacco: Not on file  . Alcohol Use: Yes   OB History    No data available     Review of Systems  Constitutional: Negative for fever.  Eyes: Positive for blurred vision. Negative for photophobia.  Musculoskeletal: Negative for neck pain.  Neurological: Positive for headaches. Negative for focal weakness, seizures, weakness and loss of balance.  All other systems reviewed and are negative.     Allergies  Review of patient's allergies indicates no known allergies.  Home Medications   Prior to Admission medications   Medication Sig Start Date End Date Taking? Authorizing Provider  HYDROcodone-acetaminophen (NORCO/VICODIN) 5-325 MG per tablet Take 1 tablet by mouth every 6 (six) hours as needed for moderate pain. 08/08/14  Yes Vivi Barrack, DPM  acetaZOLAMIDE (DIAMOX) 250 MG tablet Take 1 tablet (250 mg total) by mouth 2 (two) times daily. 08/29/14   Dorna Leitz, MD  cephALEXin (KEFLEX) 500 MG capsule Take 500 mg by mouth 3 (three) times daily.    Vivi Barrack, DPM  meloxicam (MOBIC) 7.5 MG tablet Take 1 tablet (7.5 mg total) by mouth daily. 05/26/14   Vivi Barrack, DPM  oxyCODONE-acetaminophen (PERCOCET/ROXICET) 5-325 MG per tablet Take 1 tablet by mouth every 4 (four) hours as needed for severe pain. 07/17/14   Vivi Barrack, DPM  promethazine (PHENERGAN) 12.5 MG tablet Take 12.5 mg by mouth every 6 (six) hours as needed for nausea or vomiting.    Vivi Barrack, DPM   BP 110/63 mmHg  Pulse 75  Temp(Src) 98.5 F (36.9 C) (Oral)  Resp 16  Ht  (1.626 m)  Wt 222 lb (100.699 kg)  BMI 38.09 kg/m2  SpO2 100%  LMP 07/17/2014 Physical Exam  Constitutional: She appears well-developed and well-nourished. No distress.  HENT:  Head: Normocephalic and atraumatic.  Mouth/Throat: Oropharynx is clear and moist.  Eyes: Conjunctivae and EOM are normal. Pupils are equal, round, and reactive to light.  Papilledema OS. None noted OD with sharp, crisp disc.  Neck: Normal range of motion. Neck supple.  Cardiovascular: Regular rhythm, normal heart sounds and intact distal pulses.  Exam reveals no gallop and no friction rub.   No murmur heard. Pulmonary/Chest: Effort normal and breath sounds normal. No respiratory distress. She has no wheezes. She has no rales.  Abdominal: Soft. Bowel sounds are normal. There  is no tenderness.  Neurological: She is alert. She has normal strength. No cranial nerve deficit or sensory deficit. Coordination and gait normal.  Skin: Skin is warm and dry. No rash noted. She is not diaphoretic.  Psychiatric: She has a normal mood and affect. Her behavior is normal. Judgment and thought content normal.    ED Course  Procedures (including critical care time) Labs Review Labs Reviewed   CBC WITH DIFFERENTIAL/PLATELET - Abnormal; Notable for the following:    Lymphocytes Relative 48 (*)    All other components within normal limits  BASIC METABOLIC PANEL  PROTIME-INR    Imaging Review Mr Laqueta Jean Wo Contrast  08/29/2014   CLINICAL DATA:  Headache and LEFT eye pain for 3 months, intermittent and not severe. Intermittent blurry vision. Optometrist noted LEFT optic nerve swelling.  EXAM: MRI HEAD AND ORBITS WITHOUT AND WITH CONTRAST  TECHNIQUE: Multiplanar, multiecho pulse sequences of the brain and surrounding structures were obtained without and with intravenous contrast. Multiplanar, multiecho pulse sequences of the orbits and surrounding structures were obtained including fat saturation techniques, before and after intravenous contrast administration.  CONTRAST:  15mL MULTIHANCE GADOBENATE DIMEGLUMINE 529 MG/ML IV SOLN  COMPARISON:  None.  FINDINGS: MRI HEAD FINDINGS  The ventricles and sulci are normal for patient's age. No abnormal parenchymal signal, mass lesions, mass effect. No abnormal parenchymal enhancement. A few tiny T2 hyperintensities in the bifrontal subcortical white matter, nonspecific. No reduced diffusion to suggest acute ischemia nor hyperacute demyelination. No susceptibility artifact to suggest hemorrhage.  No abnormal extra-axial fluid collections. No extra-axial masses nor leptomeningeal enhancement. Normal major intracranial vascular flow voids seen at the skull base.  Mildly expanded sella with somewhat flattened superior margin of the pituitary gland. Pituitary infundibulum is new nondisplaced Visualized paranasal sinuses and mastoid air cells are well-aerated. No suspicious calvarial bone marrow signal. Craniocervical junction maintained.  MRI ORBITS FINDINGS  Ocular globes intact, lenses are located. Redundant, somewhat patulous nerve sleep complexes, LEFT greater the RIGHT though motion degrades sensitivity. No convincing evidence of abnormal optic nerve signal  though mild motion degrades sensitivity. No optic nerve enhancement.  Extraocular muscles located, normal morphology and signal characteristics. Preservation of the orbital fat. Superior ophthalmic veins are not enlarged.  No suspicious calvarial bone marrow signal. Paranasal sinuses are well aerated.  IMPRESSION: MRI BRAIN: No acute intracranial process, no MR findings of demyelination. Mild nonspecific white matter changes.  Expanded fluid filled sella consistent with empty sella, which is associated with intracranial hypertension.  MRI ORBITS: Patulous optic nerve sheath complexes, which is associated with idiopathic intracranial hypertension. No abnormal optic nerve enhancement.   Electronically Signed   By: Awilda Metro   On: 08/29/2014 22:27   Mr Orbits Wo/w Cm  08/29/2014   CLINICAL DATA:  Headache and LEFT eye pain for 3 months, intermittent and not severe. Intermittent blurry vision. Optometrist noted LEFT optic nerve swelling.  EXAM: MRI HEAD AND ORBITS WITHOUT AND WITH CONTRAST  TECHNIQUE: Multiplanar, multiecho pulse sequences of the brain and surrounding structures were obtained without and with intravenous contrast. Multiplanar, multiecho pulse sequences of the orbits and surrounding structures were obtained including fat saturation techniques, before and after intravenous contrast administration.  CONTRAST:  15mL MULTIHANCE GADOBENATE DIMEGLUMINE 529 MG/ML IV SOLN  COMPARISON:  None.  FINDINGS: MRI HEAD FINDINGS  The ventricles and sulci are normal for patient's age. No abnormal parenchymal signal, mass lesions, mass effect. No abnormal parenchymal enhancement. A few tiny T2 hyperintensities in the bifrontal subcortical  white matter, nonspecific. No reduced diffusion to suggest acute ischemia nor hyperacute demyelination. No susceptibility artifact to suggest hemorrhage.  No abnormal extra-axial fluid collections. No extra-axial masses nor leptomeningeal enhancement. Normal major intracranial  vascular flow voids seen at the skull base.  Mildly expanded sella with somewhat flattened superior margin of the pituitary gland. Pituitary infundibulum is new nondisplaced Visualized paranasal sinuses and mastoid air cells are well-aerated. No suspicious calvarial bone marrow signal. Craniocervical junction maintained.  MRI ORBITS FINDINGS  Ocular globes intact, lenses are located. Redundant, somewhat patulous nerve sleep complexes, LEFT greater the RIGHT though motion degrades sensitivity. No convincing evidence of abnormal optic nerve signal though mild motion degrades sensitivity. No optic nerve enhancement.  Extraocular muscles located, normal morphology and signal characteristics. Preservation of the orbital fat. Superior ophthalmic veins are not enlarged.  No suspicious calvarial bone marrow signal. Paranasal sinuses are well aerated.  IMPRESSION: MRI BRAIN: No acute intracranial process, no MR findings of demyelination. Mild nonspecific white matter changes.  Expanded fluid filled sella consistent with empty sella, which is associated with intracranial hypertension.  MRI ORBITS: Patulous optic nerve sheath complexes, which is associated with idiopathic intracranial hypertension. No abnormal optic nerve enhancement.   Electronically Signed   By: Awilda Metroourtnay  Bloomer   On: 08/29/2014 22:27     EKG Interpretation None      MDM   Final diagnoses:  Optic nerve edema  Left-sided headache  Papilledema  Pseudotumor cerebri    29 year old female presents as a transfer from outside hospital for papilledema, headache. Seen by optometrist earlier today and found out papilledema left eye greater than right. Refused LP and transferred here for MRI.  Asymptomatic on my exam, with paroxysmal headaches worsen the night and mornings which is concerning for possible mass or pseudotumor. We will obtain MRI with and without contrast. Screening labs also sent.  Laboratory workup reassuring. MRI shows likely  pseudotumor cerebri. Again, she is asymptomatic on my exam. Nonfocal neurologic exam. I discussed the case with the neurologist on-call and we will start her on Diamox 250 mg twice a day with neurology follow-up in the next 2 days. The patient was in agreement with this plan and will schedule follow-up. Stable for discharge.  Dorna LeitzAlex Valla Pacey, MD 08/29/14 [id number]  Jerelyn ScottMartha Linker, MD 08/29/14 [phone]

## 2014-08-29 NOTE — Discharge Instructions (Signed)
Pseudotumor Cerebri Pseudotumor cerebri, also called idiopathic intracranial hypertension, is a condition that occurs due to increased pressure within your skull. Although some of the symptoms resemble those of a brain tumor, it is not a brain tumor. Symptoms occur when the increased pressure in your skull compresses brain structures. For example, pressure on the nerve responsible for vision (optic nerve) causes it to swell, resulting in visual symptoms. Pseudotumor cerebri tends to occur in obese women younger than 27 years of age. However, men and children can also develop this condition. SYMPTOMS  Symptoms of pseudotumor cerebri occur due to increased pressure within the skull. Symptoms may include:   Headaches.  Nausea and vomiting.  Dizziness.  High blood pressure.   Ringing in the ears.  Double or blurred vision.  Brief episodes of complete loss of vision.  Pain in the back, neck, or shoulders. DIAGNOSIS  Pseudotumor cerebri is diagnosed through:  A detailed eye exam, which can reveal a swollen optic nerve, as well as identifying issues such as blind spots in the vision.  An MRI or CT scan to rule out other disorders that can cause similar symptoms, such as brain tumors.  A spinal tap (lumbar puncture), which can demonstrate increased pressure within the skull. TREATMENT  There are several ways that pseudotumor cerebri is treated, including:  Medicines to decrease the production of spinal fluid and lower the pressure within your skull.  Medicines to prevent or treat headaches.  Surgery to create an opening in your optic nerve to allow excess fluid to drain out.  Surgery to place drains (shunts) in your brain to remove excess fluid. HOME CARE INSTRUCTIONS   Take all medicines as directed by your health care provider.  Go to all of your follow-up appointments.  Lose weight if you are overweight. SEEK MEDICAL CARE IF:  Any symptoms come back.  You develop  trouble with hearing, vision, balance, or your sense of smell.  You cannot eat or drink what you need.  You are more weak or tired than usual.   You are losing weight without trying. SEEK IMMEDIATE MEDICAL CARE IF:  You have new symptoms such as vision problems or difficulty walking.   You have a seizure.   You have trouble breathing.   You have a fever.  Document Released: 06/06/2013 Document Reviewed: 06/06/2013 [employer] Patient Information 2015 [employer]. This information is not intended to replace advice given to you by your health care provider. Make sure you discuss any questions you have with your health care provider.  

## 2014-08-29 NOTE — ED Notes (Signed)
Pt reports left eye pain, headache, reports intermittent headaches since December. States seen by eye doctor Laural Benes([facility]) today and referred to ED for  "optic nerve head swelling OS>OD. She needs an MRI and LP. Suspect Pseudotumor Cerebri but r/o other patho."

## 2014-08-31 ENCOUNTER — Encounter: Payer: Self-pay | Admitting: Neurology

## 2014-08-31 ENCOUNTER — Ambulatory Visit (INDEPENDENT_AMBULATORY_CARE_PROVIDER_SITE_OTHER): Payer: BLUE CROSS/BLUE SHIELD | Admitting: Neurology

## 2014-08-31 VITALS — BP 122/72 | HR 68 | Ht 64.0 in | Wt 237.0 lb

## 2014-08-31 DIAGNOSIS — G932 Benign intracranial hypertension: Secondary | ICD-10-CM

## 2014-08-31 DIAGNOSIS — G43109 Migraine with aura, not intractable, without status migrainosus: Secondary | ICD-10-CM | POA: Diagnosis not present

## 2014-08-31 MED ORDER — RIZATRIPTAN BENZOATE 5 MG PO TBDP: 5.0000 mg | ORAL_TABLET | ORAL | Status: DC | PRN

## 2014-08-31 MED ORDER — TOPIRAMATE 100 MG PO TABS: ORAL_TABLET | ORAL | Status: DC

## 2014-08-31 NOTE — Progress Notes (Signed)
PATIENT: Lisa Stafford DOB: 10-09-1985  HISTORICAL  Lisa Sessionsngela L Manter is a 29 yo RH AAF referred by her optometrist Do. 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 Laural BenesJohnson to have bilateral papillary edema, left worse than right,  She was sent for MRI of the brain, August 29 2014, we have reviewed the film, No acute intracranial process, expanded fluid filled sella consistent with and the sella, MRI of orbit, no optic nerve enhancement,Patulous optic nerve sheath complexes  REVIEW OF SYSTEMS: Full 14 system review of systems performed and notable only for weight gain, blurry vision, depression  ALLERGIES: No Known Allergies  HOME MEDICATIONS: Current Outpatient Prescriptions  Medication Sig Dispense Refill  . acetaZOLAMIDE (DIAMOX) 250 MG tablet Take 1 tablet (250 mg total) by mouth 2 (two) times daily. 14 tablet 0  . HYDROcodone-acetaminophen (NORCO/VICODIN) 5-325 MG per tablet Take 1 tablet by mouth every 6 (six) hours as needed for moderate pain. 30 tablet 0  . meloxicam (MOBIC) 7.5 MG tablet Take 1 tablet (7.5 mg total) by mouth daily. 30 tablet 0  . promethazine (PHENERGAN) 12.5 MG tablet Take 12.5 mg by mouth every 6 (six) hours as needed for nausea or vomiting.     No current facility-administered medications for this visit.    PAST MEDICAL  HISTORY: Past Medical History  Diagnosis Date  . Foot pain, right   . Pseudotumor cerebri   . Optic nerve edema     PAST SURGICAL HISTORY: Past Surgical History  Procedure Laterality Date  . Foot surgery Right   . Wisdom tooth extraction      x 4    FAMILY HISTORY: Family History  Problem Relation Age of Onset  . Renal Disease Mother   . Hypertension Mother   . Healthy Father   . Breast cancer Paternal Grandmother   . Prostate cancer Paternal Grandfather   . Diabetes Maternal Grandmother   . Heart attack Maternal Grandmother     SOCIAL HISTORY:  History   Social History  . Marital Status: Single    Spouse Name: N/A  . Number of Children: 0  . Years of Education: 15   Occupational History  . [employer]    Social History Main Topics  . Smoking status: Never Smoker   . Smokeless tobacco: Not on file  . Alcohol Use: 0.0 oz/week    0 Standard drinks or equivalent per week     Comment: Socially   . Drug Use: No  . Sexual Activity: Not on file   Other Topics Concern  . Not on file   Social History Narrative   Lives at home with girlfriend.   Right-handed.   Occasional use of caffeine.     PHYSICAL EXAM   Filed Vitals:   08/31/14 0844  BP: 122/72  Pulse: 68  Height: 5\' 4"  (1.626 m)  Weight: 237 lb (107.502 kg)    Not  recorded      Body mass index is 40.66 kg/(m^2).  PHYSICAL EXAMNIATION:  Gen: NAD, conversant, well nourised, obese, well groomed                     Cardiovascular: Regular rate rhythm, no peripheral edema, warm, nontender. Eyes: Conjunctivae clear without exudates or hemorrhage Neck: Supple, no carotid bruise. Pulmonary: Clear to auscultation bilaterally   NEUROLOGICAL EXAM:  MENTAL STATUS: Speech:    Speech is normal; fluent and spontaneous with normal comprehension.  Cognition:    The patient is oriented to person, place, and time;     recent and remote memory intact;     language fluent;     normal  attention, concentration,     fund of knowledge.  CRANIAL NERVES: CN II: Visual fields are full to confrontation. Fundoscopic exam showed bilateral papillary edema, left worse than the right, I was not able to appreciate venous pulsations on either side, more blurry at on the left side,Pupils are 4 mm and briskly reactive to light. Visual acuity is 20/20 bilaterally. CN III, IV, VI: extraocular movement are normal. No ptosis. CN V: Facial sensation is intact to pinprick in all 3 divisions bilaterally. Corneal responses are intact.  CN VII: Face is symmetric with normal eye closure and smile. CN VIII: Hearing is normal to rubbing fingers CN IX, X: Palate elevates symmetrically. Phonation is normal. CN XI: Head turning and shoulder shrug are intact CN XII: Tongue is midline with normal movements and no atrophy.  MOTOR: There is no pronator drift of out-stretched arms. Muscle bulk and tone are normal. Muscle strength is normal.   Shoulder abduction Shoulder external rotation Elbow flexion Elbow extension Wrist flexion Wrist extension Finger abduction Hip flexion Knee flexion Knee extension Ankle dorsi flexion Ankle plantar flexion  R L REFLEXES: Reflexes are 2+ and symmetric at the biceps, triceps, knees, and ankles. Plantar responses are flexor.  SENSORY: Light touch, pinprick, position sense, and vibration sense are intact in fingers and toes.  COORDINATION: Rapid alternating movements and fine finger movements are intact. There is no dysmetria on finger-to-nose and heel-knee-shin. There are no abnormal or extraneous movements.   GAIT/STANCE: Cautious, right ankle brace, Romberg is absent.   DIAGNOSTIC DATA (LABS, IMAGING, TESTING) - I reviewed patient records, labs, notes, testing and imaging myself where available.  Lab Results  Component Value Date   WBC 4.4 08/29/2014   HGB 13.3 08/29/2014   HCT 40.5 08/29/2014   MCV  84.7 08/29/2014   PLT 262 08/29/2014      Component Value Date/Time   NA 139 08/29/2014 2010   K 3.8 08/29/2014 2010   CL 105 08/29/2014 2010   CO2 27 08/29/2014 2010   GLUCOSE 80 08/29/2014 2010   BUN 12 08/29/2014 2010   CREATININE 0.86 08/29/2014 2010   CALCIUM 9.1 08/29/2014 2010   GFRNONAA >90 08/29/2014 2010   GFRAA >90 08/29/2014 2010    ASSESSMENT AND PLAN  REBECKAH MASIH is a 29 y.o. female  with history of migraine headaches, presenting with frequent migraine since January 2016, weight gain of 20 pounds over past 2 months, blurry vision, difficulty focusing, whooshing sound in her left ear, fundoscopy demonstrated bilateral papillary edema, left worse than right MRI of the brain showed an empty sellar,  1 most  consistent with pseudotumor cerebri 2 fluoroscopy guided lumbar puncture 3. She could not tolerate Diamox, will try Topamax, titrating to 100 mg twice a day , Maxalt as needed for headaches  4 return to clinic in 3-4 weeks ,     Levert Feinstein, M.D. Ph.D.  [facility] [address] [place], [place] [address] Ph: [phone] Fax: [phone]

## 2014-09-05 ENCOUNTER — Ambulatory Visit (INDEPENDENT_AMBULATORY_CARE_PROVIDER_SITE_OTHER): Payer: BLUE CROSS/BLUE SHIELD | Admitting: Podiatry

## 2014-09-05 ENCOUNTER — Encounter: Payer: Self-pay | Admitting: Podiatry

## 2014-09-05 VITALS — BP 110/74 | HR 92

## 2014-09-05 DIAGNOSIS — Z9889 Other specified postprocedural states: Secondary | ICD-10-CM

## 2014-09-09 NOTE — Progress Notes (Signed)
Patient ID: Lisa Stafford, female   DOB: 11-Dec-1985, 29 y.o.   MRN: [id number]  Subjective: Lisa Stafford presents the office they postop visit #4 status post right foot plantar fibroma excision. After last appointment she did call the office in the incision had opened however she states that it was the scab overlying the incision which peeled off and had a small amount of bleeding however should she states the incision never fully opened. She denies any surrounding redness or red streaks. She denies any drainage from the incision. She does continue to have some discomfort along the incision site particularly with weightbearing although it does appear to be improving. Denies any systemic complaints as fevers, chills, nausea, vomiting. Denies any calf pain, chest pain, shortness of breath. No other complaints at this time. Since last appointment she has follow-up with neurology for other issues.  Objective: AAO 3, NAD, ambulating in regular shoe gear DP/PT pulses palpable, CRT less than 3 seconds Protective sensation intact with Simms Weinstein monofilament, Achilles tendon reflex intact. Incision along the plantar aspect of the right foot is well coapted without any evidence of dehiscence. There is no drainage, purulence, surrounding erythema, ascending cellulitis, malodor, or any other clinical signs of infection. There is no significant edema overlying the surgical site. There is mild tenderness directly upon palpation of the surgical site. No other areas of tenderness to bilateral lower extremities. No overlying edema, erythema, increase in warmth. MMT 5/5, ROM WNL No other open lesions or pre-ulcerative lesions identified bilaterally. No pain with calf compression, swelling, warmth, erythema.  Assessment: 29 year old female status post right foot excision plantar fibroma, doing well  Plan: -Treatment options were discussed with the patient including alternatives, risks, competitions. -At this  time discussed the patient to continue to use vitamin E cream to the size over the scar to help with scar tissue. Discussed with her that if the scar becomes thickened if she has problems in the future can use EPAT to help with scaring.  -She is inquiring about surgery the left foot. Discussed with her once her other issues have resolved can perform surgery the left foot if desired to remove the liner fibroma. -Continue to monitor for any signs or symptoms of infection and directed to call the office immediately should any occur or go to the ER. -Follow-up in 4 weeks or sooner if any problems are to arise. In the meantime, encouraged to call the office with any questions, concerns, change in symptoms.

## 2014-09-18 ENCOUNTER — Telehealth: Payer: Self-pay | Admitting: *Deleted

## 2014-09-18 NOTE — Telephone Encounter (Signed)
[facility]

## 2014-09-24 ENCOUNTER — Telehealth: Payer: Self-pay | Admitting: *Deleted

## 2014-09-24 ENCOUNTER — Telehealth: Payer: Self-pay | Admitting: Podiatry

## 2014-09-24 ENCOUNTER — Encounter: Payer: Self-pay | Admitting: Podiatry

## 2014-09-24 NOTE — Telephone Encounter (Signed)
Patient form on Michelle desk. 

## 2014-09-24 NOTE — Telephone Encounter (Signed)
Patient states she has been calling since last Monday trying to get in touch with you Dr. Ardelle AntonWagoner. She states it is important that she speaks to you in regards to her going back to work. I will also send this to Marylu LundJanet in case there is anything Marylu LundJanet can help with. Patient asked that you give her a call at your earliest opportunity.

## 2014-09-27 ENCOUNTER — Encounter: Payer: Self-pay | Admitting: *Deleted

## 2014-09-28 ENCOUNTER — Telehealth: Payer: Self-pay | Admitting: *Deleted

## 2014-09-28 NOTE — Telephone Encounter (Signed)
Left message for patient to return my call (have a question about her FMLA papers).

## 2014-10-01 ENCOUNTER — Telehealth: Payer: Self-pay | Admitting: *Deleted

## 2014-10-01 NOTE — Telephone Encounter (Signed)
[facility] [phone].

## 2014-10-01 NOTE — Telephone Encounter (Signed)
Paper work completed and returned to medical records. 

## 2014-10-03 ENCOUNTER — Ambulatory Visit (INDEPENDENT_AMBULATORY_CARE_PROVIDER_SITE_OTHER): Payer: BLUE CROSS/BLUE SHIELD | Admitting: Podiatry

## 2014-10-03 ENCOUNTER — Encounter: Payer: Self-pay | Admitting: Podiatry

## 2014-10-03 VITALS — BP 115/62 | HR 67 | Resp 18

## 2014-10-03 DIAGNOSIS — Z9889 Other specified postprocedural states: Secondary | ICD-10-CM

## 2014-10-03 DIAGNOSIS — M722 Plantar fascial fibromatosis: Secondary | ICD-10-CM

## 2014-10-04 ENCOUNTER — Encounter: Payer: Self-pay | Admitting: Neurology

## 2014-10-04 ENCOUNTER — Ambulatory Visit (INDEPENDENT_AMBULATORY_CARE_PROVIDER_SITE_OTHER): Payer: BLUE CROSS/BLUE SHIELD | Admitting: Neurology

## 2014-10-04 VITALS — BP 112/84 | HR 72 | Ht 64.0 in | Wt 237.0 lb

## 2014-10-04 DIAGNOSIS — G932 Benign intracranial hypertension: Secondary | ICD-10-CM | POA: Diagnosis not present

## 2014-10-04 DIAGNOSIS — G43109 Migraine with aura, not intractable, without status migrainosus: Secondary | ICD-10-CM

## 2014-10-04 MED ORDER — TOPIRAMATE 100 MG PO TABS: 100.0000 mg | ORAL_TABLET | Freq: Two times a day (BID) | ORAL | Status: DC

## 2014-10-04 NOTE — Progress Notes (Signed)
PATIENT: Lisa Stafford DOB: 02/29/1986  HISTORICAL  Lisa Stafford is a 29 yo RH AAF referred by her optometrist Do. 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 Laural BenesJohnson to have bilateral papillary edema, left worse than right,  She was sent for MRI of the brain, August 29 2014, we have reviewed the film, No acute intracranial process, expanded fluid filled sella consistent with and the sella, MRI of orbit, no optic nerve enhancement,Patulous optic nerve sheath complexes   UPDATE October 04 2014: She is taking Topamax 100mg  qhs, Maxalt prn, which has been very helpful, vision is better. Has headaches 2-3 times each month,   She did not have LP, because of conflict with her schedule, she does not want to reschedule it now, she understands the potential risk of long term increased intracranial pressure can permanently damage her vision   REVIEW OF SYSTEMS: Full 14 system review of systems performed and notable only for running nose, ALLERGIES: No Known Allergies  HOME MEDICATIONS: Current Outpatient Prescriptions  Medication Sig Dispense Refill  . rizatriptan (MAXALT-MLT) 5 MG disintegrating tablet Take 1 tablet (5 mg total) by mouth as needed. May repeat in 2 hours if needed 15 tablet 6  . topiramate (TOPAMAX) 100 MG tablet 1/2 po bid xone week, then  one tab po bid 60 tablet 11   No current facility-administered medications for this visit.    PAST MEDICAL HISTORY: Past Medical History  Diagnosis Date  . Foot pain, right   . Pseudotumor cerebri   . Optic nerve edema     PAST SURGICAL HISTORY: Past Surgical History  Procedure Laterality Date  . Foot surgery Right   . Wisdom tooth extraction      x 4    FAMILY HISTORY: Family History  Problem Relation Age of Onset  . Renal Disease Mother   . Hypertension Mother   . Healthy Father   . Breast cancer Paternal Grandmother   . Prostate cancer Paternal Grandfather   . Diabetes Maternal Grandmother   . Heart attack Maternal Grandmother     SOCIAL HISTORY:  History   Social History  . Marital Status: Single    Spouse Name: N/A  . Number of Children: 0  . Years of Education: 15   Occupational History  . [employer]    Social History Main Topics  . Smoking status: Never Smoker   . Smokeless tobacco: Not on file  . Alcohol Use: 0.0 oz/week    0 Standard drinks or equivalent per week     Comment: Socially   . Drug Use: No  . Sexual Activity: Not on file   Other Topics Concern  . Not on file   Social History Narrative   Lives at home with girlfriend.   Right-handed.   Occasional use of caffeine.     PHYSICAL EXAM   Filed Vitals:   10/04/14 1023  BP:  112/84  Pulse: 72  Height:  (1.626 m)  Weight: 237 lb (107.502 kg)    Not recorded      Body mass index is 40.66 kg/(m^2).  PHYSICAL EXAMNIATION:  Gen: NAD, conversant, well nourised, obese, well groomed                     Cardiovascular: Regular rate rhythm, no peripheral edema, warm, nontender. Eyes: Conjunctivae clear without exudates or hemorrhage Neck: Supple, no carotid bruise. Pulmonary: Clear to auscultation bilaterally   NEUROLOGICAL EXAM:  MENTAL STATUS: Speech:    Speech is normal; fluent and spontaneous with normal comprehension.  Cognition:    The patient is  oriented to person, place, and time;     recent and remote memory intact;     language fluent;     normal attention, concentration,     fund of knowledge.  CRANIAL NERVES: CN II: Visual fields are full to confrontation. Fundoscopic exam showed mild bilateral papillary edema, left worse than the right, I was not able to appreciate venous pulsations on either side, more blurry at on the left side,Pupils are 4 mm and briskly reactive to light. Visual acuity is 20/20 bilaterally. CN III, IV, VI: extraocular movement are normal. No ptosis. CN V: Facial sensation is intact to pinprick in all 3 divisions bilaterally. Corneal responses are intact.  CN VII: Face is symmetric with normal eye closure and smile. CN VIII: Hearing is normal to rubbing fingers CN IX, X: Palate elevates symmetrically. Phonation is normal. CN XI: Head turning and shoulder shrug are intact CN XII: Tongue is midline with normal movements and no atrophy.  MOTOR: There is no pronator drift of out-stretched arms. Muscle bulk and tone are normal. Muscle strength is normal.   Shoulder abduction Shoulder external rotation Elbow flexion Elbow extension Wrist flexion Wrist extension Finger abduction Hip flexion Knee flexion Knee extension Ankle dorsi flexion Ankle plantar flexion  R L REFLEXES: Reflexes are 2+ and symmetric at the biceps, triceps, knees, and ankles. Plantar responses are flexor.  SENSORY: Light touch, pinprick, position sense, and vibration sense are intact in fingers and toes.  COORDINATION: Rapid alternating movements and fine finger movements are intact. There is no dysmetria on finger-to-nose and heel-knee-shin. There are no abnormal or extraneous movements.   GAIT/STANCE: Cautious, right ankle brace, Romberg is absent.   DIAGNOSTIC DATA (LABS, IMAGING, TESTING) - I reviewed patient records, labs, notes, testing and imaging myself where  available.  Lab Results  Component Value Date   WBC 4.4 08/29/2014   HGB 13.3 08/29/2014   HCT 40.5 08/29/2014   MCV 84.7 08/29/2014   PLT 262 08/29/2014      Component Value Date/Time   NA 139 08/29/2014 2010   K 3.8 08/29/2014 2010   CL 105 08/29/2014 2010   CO2 27 08/29/2014 2010   GLUCOSE 80 08/29/2014 2010   BUN 12 08/29/2014 2010   CREATININE 0.86 08/29/2014 2010   CALCIUM 9.1 08/29/2014 2010   GFRNONAA >90 08/29/2014 2010   GFRAA >90 08/29/2014 2010    ASSESSMENT AND PLAN  RAIANA PHARRIS is a 29 y.o. female  with history of migraine headaches, presenting with frequent migraine since January 2016, weight gain of 20 pounds over past 2 months, blurry vision, difficulty focusing, whooshing sound in her left  ear, fundoscopy demonstrated bilateral papillary edema, left worse than right MRI of the brain showed an empty sellar,  1 most consistent with pseudotumor cerebri 2 she is hesitate to get lumbar puncture because of her schedule 3. Keep Topamax, titrating to 100 mg twice a day , Maxalt as needed for headaches  4 return to clinic in one month   Levert Feinstein, M.D. Ph.D.  [facility] [address] [place], [place] [address] Ph: [phone] Fax: [phone]

## 2014-10-04 NOTE — Progress Notes (Signed)
Patient ID: Beverly Sessionsngela L Davisson, female   DOB: 04/25/1986, 29 y.o.   MRN: [id number]  Subjective: Ms. [name] presents the office they postop visit #5 status post right foot plantar fibroma excision. Since last appointment she states that she is return to work. She does get some intermittent discomfort over on the incision site on the plantar aspect of the right foot however she does believe that the pain has been improving. She also states the area has started to swell somewhat she's been back on her feet more. She denies any systemic complaints as fevers, chills, nausea, vomiting. Denies any calf pain, chest pain, shortness of breath. No other complaints at this time.   Objective: AAO 3, NAD, ambulating in regular shoe gear DP/PT pulses palpable, CRT less than 3 seconds Protective sensation intact with Simms Weinstein monofilament, Achilles tendon reflex intact. Incision along the plantar aspect of the right foot is well coapted without any evidence of dehiscence and a scar has formed. There is no drainage, purulence, surrounding erythema, ascending cellulitis, malodor, or any other clinical signs of infection. There is no significant edema overlying the surgical site or other areas of the foot/ankle. There is mild tenderness directly upon palpation of the surgical site although it does appear to be improved. No other areas of tenderness to bilateral lower extremities. No overlying edema, erythema, increase in warmth. MMT 5/5, ROM WNL No other open lesions or pre-ulcerative lesions identified bilaterally. No pain with calf compression, swelling, warmth, erythema.  Assessment: 29 year old female status post right foot excision plantar fibroma, doing well  Plan: -Treatment options were discussed with the patient including alternatives, risks, competitions. -Continue to use vitamin E cream over the scar to help with scar tissue. Discussed with her that if the scar becomes thickened if she has problems  in the future can try and use EPAT to help with scaring.Will consider this at next appointment.   -Continue to monitor for any signs or symptoms of infection and directed to call the office immediately should any occur or go to the ER. -Follow-up in 4 weeks or sooner if any problems are to arise. In the meantime, encouraged to call the office with any questions, concerns, change in symptoms.

## 2014-10-31 ENCOUNTER — Encounter: Payer: Self-pay | Admitting: *Deleted

## 2014-10-31 ENCOUNTER — Ambulatory Visit: Payer: BLUE CROSS/BLUE SHIELD | Admitting: Podiatry

## 2014-10-31 ENCOUNTER — Telehealth: Payer: Self-pay | Admitting: Neurology

## 2014-10-31 NOTE — Telephone Encounter (Signed)
I spoke with patient.  Says she increased her Topamax dose approximately 3 weeks ago.  Says for the past week she has noticed burning/tinglining in her feet and hands, has tremors throughout the day, and feels anxious.  Questioning if dose needs to be adjusted.  As well, asking if something mild can be prescribed for pain she is having in hands and feet.  She has tried Motrin, and it was not beneficial.  Please advise.  Thank you.   (Encounter was already closed when it was sent to me)

## 2014-10-31 NOTE — Telephone Encounter (Signed)
Pt called and stated that her Rx. topiramate (TOPAMAX) 100 MG tablet is causing her to have some bad side effects (shaking). She was originally taking one dose a day but at her last appt she was instructed that she should be taking two. She has been taking two for the last three weeks, she just started having the issue within the last week. Pt states that she also feels very "weird". Please call and advise.

## 2014-10-31 NOTE — Telephone Encounter (Signed)
Spoke to patient - she wanted to go ahead and decrease her morning dose rather than waiting to see if the side effects would resolve.  She will call back with any further concerns.

## 2014-10-31 NOTE — Telephone Encounter (Signed)
Chart reviewed, the only change that I made was increase her Topamax from 100 mg every night to 100 mg twice a day, she described paresthesia in her feet and hands due to Topamax  Advice patient, may just Topamax to 100 mg half tablet every morning, 1 tablet every evening, side effect supposed to gradually subsided if she takes the medication longer.

## 2014-11-01 ENCOUNTER — Ambulatory Visit: Payer: BLUE CROSS/BLUE SHIELD | Admitting: Neurology

## 2014-11-14 ENCOUNTER — Encounter: Payer: Self-pay | Admitting: Podiatry

## 2014-11-14 ENCOUNTER — Ambulatory Visit (INDEPENDENT_AMBULATORY_CARE_PROVIDER_SITE_OTHER): Payer: BLUE CROSS/BLUE SHIELD | Admitting: Podiatry

## 2014-11-14 VITALS — BP 120/74 | HR 83 | Resp 18

## 2014-11-14 DIAGNOSIS — M722 Plantar fascial fibromatosis: Secondary | ICD-10-CM | POA: Diagnosis not present

## 2014-11-14 DIAGNOSIS — Z9889 Other specified postprocedural states: Secondary | ICD-10-CM

## 2014-11-14 NOTE — Progress Notes (Signed)
Patient ID: Lisa Stafford, female   DOB: 1985/09/18, 29 y.o.   MRN: [id number]  Subjective: Lisa Stafford presents the office today status post right foot plantar fibroma excision. Since last appointment she has had several neurological issues for which she is being treated by a neurologist. She states that she has had seizures after being on Topamax as well as tingling and numbness to her hands and feet. She also states that she's been feeling depressed and she has since taken to her primary care physician about this as well. She denies any pain to the surgical site and denies any swelling or redness. She states that she is able to wear regular shoe without any difficulty. She states the scar has flattened out significantly. She's been applying cocoa butter to the incision daily. She denies any systemic complaints as fevers, chills, nausea, vomiting. Denies any calf pain, chest pain, shortness of breath. No other complaints at this time.   Objective: AAO 3, NAD, ambulating in regular shoe gear DP/PT pulses palpable, CRT less than 3 seconds Protective sensation intact with Simms Weinstein monofilament, Achilles tendon reflex intact. Incision along the plantar aspect of the right foot is well coapted without any evidence of dehiscence and a scar is well formed. There is no drainage, purulence, surrounding erythema, ascending cellulitis, malodor, or any other clinical signs of infection. There is no significant edema overlying the surgical site or other areas of the foot/ankle. There is no tenderness directly upon palpation of the surgical site.. No other areas of tenderness to bilateral lower extremities. No overlying edema, erythema, increase in warmth. MMT 5/5, ROM WNL No other open lesions or pre-ulcerative lesions identified bilaterally. No pain with calf compression, swelling, warmth, erythema.  Assessment: 29 year old female status post right foot excision plantar fibroma, doing  well  Plan: -Treatment options were discussed with the patient including alternatives, risks, competitions. -Continue to use vitamin E cream over the scar to help with scar tissue. Will hold off on EPAT as the incision is much improved.  -continue regular shoegear as tolerated -Follow up with neurology and PCP -Continue to monitor for any signs or symptoms of infection and directed to call the office immediately should any occur or go to the ER. -Follow-up as needed.  Call if any problems are to arise. In the meantime, encouraged to call the office with any questions, concerns, change in symptoms.

## 2014-11-15 ENCOUNTER — Ambulatory Visit (INDEPENDENT_AMBULATORY_CARE_PROVIDER_SITE_OTHER): Payer: BLUE CROSS/BLUE SHIELD | Admitting: Neurology

## 2014-11-15 ENCOUNTER — Encounter: Payer: Self-pay | Admitting: Neurology

## 2014-11-15 VITALS — BP 126/85 | HR 67 | Ht 64.0 in | Wt 231.0 lb

## 2014-11-15 DIAGNOSIS — G932 Benign intracranial hypertension: Secondary | ICD-10-CM

## 2014-11-15 DIAGNOSIS — G43109 Migraine with aura, not intractable, without status migrainosus: Secondary | ICD-10-CM | POA: Diagnosis not present

## 2014-11-15 MED ORDER — TOPIRAMATE ER 200 MG PO CAP24: 200.0000 mg | ORAL_CAPSULE | Freq: Every day | ORAL | Status: DC

## 2014-11-15 NOTE — Progress Notes (Signed)
Chief Complaint  Patient presents with  . Pseudotumor Cerebri    She is still having headaches but they are less severe.  The more severe headaches resolve easily with Maxalt.  She has some intermittent blurred vision.  She is still having some tingling in her hands and feet that started with her Topamax 100mg , BID dose.      PATIENT: Lisa Stafford DOB: 1985-10-04  HISTORICAL  Lisa Stafford is a 29 yo RH AAF referred by her optometrist Do. 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 Laural BenesJohnson to have bilateral papillary edema, left worse than right,  She was sent for MRI of the brain, August 29 2014, we have reviewed the film, No acute intracranial process, expanded fluid filled sella consistent with and the sella, MRI of orbit, no optic nerve enhancement,Patulous optic nerve sheath complexes   UPDATE October 04 2014: She is taking Topamax 100mg  qhs, Maxalt prn, which has been very helpful, vision is better. Has headaches 2-3 times each month,   She did not have LP, because of conflict with her schedule, she does not want to reschedule it now, she understands the potential risk of long term increased intracranial pressure can permanently damage her vision   UPDATE June 2nd 2016: She is now taking Topamax 100 mg twice a day, still notice bilateral feet  numbness tingling, she is also dealing with plantar fasciitis, planning on to have left plantar fasciitis procedure soon, she works out regularly, she only has much milder degree of headaches, reported 80% improvement, Maxalt as needed works well, but she rarely needs it, she has intermittent blurry vision, left side worse than right.  REVIEW OF SYSTEMS: Full 14 system review of systems performed and notable only for running nose, depression, anxiety, memory loss, numbness  ALLERGIES: No Known Allergies  HOME MEDICATIONS: Current Outpatient Prescriptions  Medication Sig Dispense Refill  . rizatriptan (MAXALT-MLT) 5 MG disintegrating tablet Take 1 tablet (5 mg total) by mouth as needed. May repeat in 2 hours if needed 15 tablet 6  . topiramate (TOPAMAX) 100 MG tablet Take 1 tablet (100 mg total) by mouth 2 (two) times daily. 1/2 po bid xone week, then one tab po bid 60 tablet 11   No current facility-administered medications for this visit.    PAST MEDICAL HISTORY: Past Medical History  Diagnosis Date  . Foot pain, right   . Pseudotumor cerebri   . Optic nerve edema     PAST SURGICAL HISTORY: Past Surgical History  Procedure Laterality Date  . Foot surgery Right   . Wisdom tooth extraction      x 4    FAMILY HISTORY: Family History  Problem Relation Age of Onset  . Renal Disease Mother   . Hypertension Mother   . Healthy Father   . Breast cancer Paternal Grandmother   . Prostate cancer Paternal Grandfather   . Diabetes Maternal Grandmother   .  Heart attack Maternal Grandmother     SOCIAL HISTORY:  History   Social History  . Marital Status: Single    Spouse Name: N/A  . Number of Children: 0  . Years of Education: 15   Occupational History  . [employer]    Social History Main Topics  . Smoking status: Never Smoker   . Smokeless tobacco: Not on file  . Alcohol Use: 0.0 oz/week    0 Standard drinks or equivalent per week     Comment: Socially     . Drug Use: No  . Sexual Activity: Not on file   Other Topics Concern  . Not on file   Social History Narrative   Lives at home with girlfriend.   Right-handed.   Occasional use of caffeine.     PHYSICAL EXAM   Filed Vitals:   11/15/14 0858  BP: 126/85  Pulse: 67  Height:  (1.626 m)  Weight: 231 lb (104.781 kg)    Not recorded      Body mass index is 39.63 kg/(m^2).  PHYSICAL EXAMNIATION:  Gen: NAD, conversant, well nourised, obese, well groomed                     Cardiovascular: Regular rate rhythm, no peripheral edema, warm, nontender. Eyes: Conjunctivae clear without exudates or hemorrhage Neck: Supple, no carotid bruise. Pulmonary: Clear to auscultation bilaterally   NEUROLOGICAL EXAM:  MENTAL STATUS: Speech:    Speech is normal; fluent and spontaneous with normal comprehension.  Cognition:    The patient is oriented to person, place, and time;     recent and remote memory intact;     language fluent;     normal attention, concentration,     fund of knowledge.  CRANIAL NERVES: CN II: Visual fields are full to confrontation. Fundoscopic exam Sharp patch bilaterally, I do not see evidence of papillary edema, I was not able to appreciate venous pulsations on either side, more blurry at on the left side,Pupils are 4 mm and briskly reactive to light. Visual acuity, right 20/20-1, left 20/30 CN III, IV, VI: extraocular movement are normal. No ptosis. CN V: Facial sensation is intact to pinprick in all 3 divisions bilaterally. Corneal responses are intact.  CN VII: Face is symmetric with normal eye closure and smile. CN VIII: Hearing is normal to rubbing fingers CN IX, X: Palate elevates symmetrically. Phonation is normal. CN XI: Head turning and shoulder shrug are intact CN XII: Tongue is midline with normal movements and no atrophy.  MOTOR: There is no pronator drift of out-stretched arms. Muscle bulk and tone are normal. Muscle strength is  normal.  REFLEXES: Reflexes are 2+ and symmetric at the biceps, triceps, knees, and ankles. Plantar responses are flexor.  SENSORY: Light touch, pinprick, position sense, and vibration sense are intact in fingers and toes.  COORDINATION: Rapid alternating movements and fine finger movements are intact. There is no dysmetria on finger-to-nose and heel-knee-shin. There are no abnormal or extraneous movements.   GAIT/STANCE: Cautious, right ankle brace, Romberg is absent.   DIAGNOSTIC DATA (LABS, IMAGING, TESTING) - I reviewed patient records, labs, notes, testing and imaging myself where available.  Lab Results  Component Value Date   WBC 4.4 08/29/2014   HGB 13.3 08/29/2014   HCT 40.5 08/29/2014   MCV 84.7 08/29/2014   PLT 262 08/29/2014      Component Value Date/Time   NA 139 08/29/2014 2010   K 3.8 08/29/2014  2010   CL 105 08/29/2014 2010   CO2 27 08/29/2014 2010   GLUCOSE 80 08/29/2014 2010   BUN 12 08/29/2014 2010   CREATININE 0.86 08/29/2014 2010   CALCIUM 9.1 08/29/2014 2010   GFRNONAA >90 08/29/2014 2010   GFRAA >90 08/29/2014 2010    ASSESSMENT AND PLAN  Lisa Stafford is a 29 y.o. female  with history of migraine headaches, presenting with frequent migraine since January 2016, weight gain of 20 pounds over past 2 months, blurry vision, previous fundoscopy demonstrated bilateral papillary edema, left worse than right,  MRI of the brain showed an empty sellar, today I do not see any significant papillary edema, was not able to appreciate venous pulsations either.  1, her headache overall has much improved with Topamax treatment, complains of side effect of numbness tingling, will change to Trokendi xr 200 mg every night  2. She still does not want to have lumbar puncture, will have her ophthalmology evaluation in 3-4 months 3, continue weight loss, exercise, return to clinic in 6 months with nurse practitioner   Levert Feinstein, M.D. Ph.D.  [facility] [address], [address] [place], [place] [address] Ph: [phone] Fax: [phone]

## 2015-01-24 ENCOUNTER — Telehealth: Payer: Self-pay | Admitting: *Deleted

## 2015-01-24 NOTE — Telephone Encounter (Signed)
She needs to come in. It has been 2 months since out last visit and I have discharged her pretty much from her postop course.

## 2015-01-24 NOTE — Telephone Encounter (Addendum)
Pt states she is taking Ibuprofen at work but it is not helping the pain.  Dr. Gabriel Rung recommendation given to pt and pt agreed, transferred to schedulers.

## 2015-03-01 ENCOUNTER — Encounter: Payer: Self-pay | Admitting: Podiatry

## 2015-03-01 ENCOUNTER — Ambulatory Visit (INDEPENDENT_AMBULATORY_CARE_PROVIDER_SITE_OTHER): Payer: BLUE CROSS/BLUE SHIELD | Admitting: Podiatry

## 2015-03-01 VITALS — BP 117/77 | HR 80 | Resp 18

## 2015-03-01 DIAGNOSIS — R609 Edema, unspecified: Secondary | ICD-10-CM | POA: Diagnosis not present

## 2015-03-01 DIAGNOSIS — R52 Pain, unspecified: Secondary | ICD-10-CM | POA: Diagnosis not present

## 2015-03-01 DIAGNOSIS — M722 Plantar fascial fibromatosis: Secondary | ICD-10-CM

## 2015-03-01 MED ORDER — DICLOFENAC SODIUM 1 % TD GEL: 2.0000 g | Freq: Four times a day (QID) | TRANSDERMAL | Status: AC

## 2015-03-01 NOTE — Patient Instructions (Signed)
Plantar Fasciitis (Heel Spur Syndrome) with Rehab The plantar fascia is a fibrous, ligament-like, soft-tissue structure that spans the bottom of the foot. Plantar fasciitis is a condition that causes pain in the foot due to inflammation of the tissue. SYMPTOMS   Pain and tenderness on the underneath side of the foot.  Pain that worsens with standing or walking. CAUSES  Plantar fasciitis is caused by irritation and injury to the plantar fascia on the underneath side of the foot. Common mechanisms of injury include:  Direct trauma to bottom of the foot.  Damage to a small nerve that runs under the foot where the main fascia attaches to the heel bone.  Stress placed on the plantar fascia due to bone spurs. RISK INCREASES WITH:   Activities that place stress on the plantar fascia (running, jumping, pivoting, or cutting).  Poor strength and flexibility.  Improperly fitted shoes.  Tight calf muscles.  Flat feet.  Failure to warm-up properly before activity.  Obesity. PREVENTION  Warm up and stretch properly before activity.  Allow for adequate recovery between workouts.  Maintain physical fitness:  Strength, flexibility, and endurance.  Cardiovascular fitness.  Maintain a health body weight.  Avoid stress on the plantar fascia.  Wear properly fitted shoes, including arch supports for individuals who have flat feet. PROGNOSIS  If treated properly, then the symptoms of plantar fasciitis usually resolve without surgery. However, occasionally surgery is necessary. RELATED COMPLICATIONS   Recurrent symptoms that may result in a chronic condition.  Problems of the lower back that are caused by compensating for the injury, such as limping.  Pain or weakness of the foot during push-off following surgery.  Chronic inflammation, scarring, and partial or complete fascia tear, occurring more often from repeated injections. TREATMENT  Treatment initially involves the use of  ice and medication to help reduce pain and inflammation. The use of strengthening and stretching exercises may help reduce pain with activity, especially stretches of the Achilles tendon. These exercises may be performed at home or with a therapist. Your caregiver may recommend that you use heel cups of arch supports to help reduce stress on the plantar fascia. Occasionally, corticosteroid injections are given to reduce inflammation. If symptoms persist for greater than 6 months despite non-surgical (conservative), then surgery may be recommended.  MEDICATION   If pain medication is necessary, then nonsteroidal anti-inflammatory medications, such as aspirin and ibuprofen, or other minor pain relievers, such as acetaminophen, are often recommended.  Do not take pain medication within 7 days before surgery.  Prescription pain relievers may be given if deemed necessary by your caregiver. Use only as directed and only as much as you need.  Corticosteroid injections may be given by your caregiver. These injections should be reserved for the most serious cases, because they may only be given a certain number of times. HEAT AND COLD  Cold treatment (icing) relieves pain and reduces inflammation. Cold treatment should be applied for 10 to 15 minutes every 2 to 3 hours for inflammation and pain and immediately after any activity that aggravates your symptoms. Use ice packs or massage the area with a piece of ice (ice massage).  Heat treatment may be used prior to performing the stretching and strengthening activities prescribed by your caregiver, physical therapist, or athletic trainer. Use a heat pack or soak the injury in warm water. SEEK IMMEDIATE MEDICAL CARE IF:  Treatment seems to offer no benefit, or the condition worsens.  Any medications produce adverse side effects. EXERCISES RANGE   OF MOTION (ROM) AND STRETCHING EXERCISES - Plantar Fasciitis (Heel Spur Syndrome) These exercises may help you  when beginning to rehabilitate your injury. Your symptoms may resolve with or without further involvement from your physician, physical therapist or athletic trainer. While completing these exercises, remember:   Restoring tissue flexibility helps normal motion to return to the joints. This allows healthier, less painful movement and activity.  An effective stretch should be held for at least 30 seconds.  A stretch should never be painful. You should only feel a gentle lengthening or release in the stretched tissue. RANGE OF MOTION - Toe Extension, Flexion  Sit with your right / left leg crossed over your opposite knee.  Grasp your toes and gently pull them back toward the top of your foot. You should feel a stretch on the bottom of your toes and/or foot.  Hold this stretch for __________ seconds.  Now, gently pull your toes toward the bottom of your foot. You should feel a stretch on the top of your toes and or foot.  Hold this stretch for __________ seconds. Repeat __________ times. Complete this stretch __________ times per day.  RANGE OF MOTION - Ankle Dorsiflexion, Active Assisted  Remove shoes and sit on a chair that is preferably not on a carpeted surface.  Place right / left foot under knee. Extend your opposite leg for support.  Keeping your heel down, slide your right / left foot back toward the chair until you feel a stretch at your ankle or calf. If you do not feel a stretch, slide your bottom forward to the edge of the chair, while still keeping your heel down.  Hold this stretch for __________ seconds. Repeat __________ times. Complete this stretch __________ times per day.  STRETCH - Gastroc, Standing  Place hands on wall.  Extend right / left leg, keeping the front knee somewhat bent.  Slightly point your toes inward on your back foot.  Keeping your right / left heel on the floor and your knee straight, shift your weight toward the wall, not allowing your back to  arch.  You should feel a gentle stretch in the right / left calf. Hold this position for __________ seconds. Repeat __________ times. Complete this stretch __________ times per day. STRETCH - Soleus, Standing  Place hands on wall.  Extend right / left leg, keeping the other knee somewhat bent.  Slightly point your toes inward on your back foot.  Keep your right / left heel on the floor, bend your back knee, and slightly shift your weight over the back leg so that you feel a gentle stretch deep in your back calf.  Hold this position for __________ seconds. Repeat __________ times. Complete this stretch __________ times per day. STRETCH - Gastrocsoleus, Standing  Note: This exercise can place a lot of stress on your foot and ankle. Please complete this exercise only if specifically instructed by your caregiver.   Place the ball of your right / left foot on a step, keeping your other foot firmly on the same step.  Hold on to the wall or a rail for balance.  Slowly lift your other foot, allowing your body weight to press your heel down over the edge of the step.  You should feel a stretch in your right / left calf.  Hold this position for __________ seconds.  Repeat this exercise with a slight bend in your right / left knee. Repeat __________ times. Complete this stretch __________ times per day.    STRENGTHENING EXERCISES - Plantar Fasciitis (Heel Spur Syndrome)  These exercises may help you when beginning to rehabilitate your injury. They may resolve your symptoms with or without further involvement from your physician, physical therapist or athletic trainer. While completing these exercises, remember:   Muscles can gain both the endurance and the strength needed for everyday activities through controlled exercises.  Complete these exercises as instructed by your physician, physical therapist or athletic trainer. Progress the resistance and repetitions only as guided. STRENGTH -  Towel Curls  Sit in a chair positioned on a non-carpeted surface.  Place your foot on a towel, keeping your heel on the floor.  Pull the towel toward your heel by only curling your toes. Keep your heel on the floor.  If instructed by your physician, physical therapist or athletic trainer, add ____________________ at the end of the towel. Repeat __________ times. Complete this exercise __________ times per day. STRENGTH - Ankle Inversion  Secure one end of a rubber exercise band/tubing to a fixed object (table, pole). Loop the other end around your foot just before your toes.  Place your fists between your knees. This will focus your strengthening at your ankle.  Slowly, pull your big toe up and in, making sure the band/tubing is positioned to resist the entire motion.  Hold this position for __________ seconds.  Have your muscles resist the band/tubing as it slowly pulls your foot back to the starting position. Repeat __________ times. Complete this exercises __________ times per day.  Document Released: 06/01/2005 Document Revised: 08/24/2011 Document Reviewed: 09/13/2008 [employer] Patient Information 2015 [employer]. This information is not intended to replace advice given to you by your health care provider. Make sure you discuss any questions you have with your health care provider.  

## 2015-03-07 NOTE — Progress Notes (Signed)
Patient ID: Lisa Stafford, female   DOB: 1985/10/14, 29 y.o.   MRN: [id number]  Subjective: 29 year old female presents to the office for concerns of right foot pain which has been ongoing for about 3-4 weeks. She states that she has pain to the bottom of her foot and pressure. She has noticed some swelling to the area. Denies any redness or increase in warmth. The incision remains well-healed scar. She denies any increase in warmth or drainage. No other complaints at this time.  Objective: AAO x3, NAD DP/PT pulses palpable, CRT less than 3 seconds Protective sensation intact with Simms Weinstein monofilament Incision the plantar medial aspect of the right foot is well coapted with eschar. There is a focal amount of edema around the area without any assisted erythema or increased warmth. There is tenderness to palpation over the area. There is a decrease in medial arch height upon weightbearing and equnius is present. Upon dorsiflexing the area of the plantar fascia and the Achilles tendon subjectively feels tight per the patient.There is no other areas of tenderness there is no pinpoint bony tenderness or pain the vibratory sensation. No open lesions or pre-ulcer lesions. There is no pain with calf compression, swelling, warmth, erythema.  Assessment: 29 year old female with right foot pain  Plan: -Treatment options discussed including all alternatives, risks, and complications -There appears to be a localized pocket inflammation around the scar from the plantar fibroma surgery. Discussed that her injection to the area however she wishes to hold off. Voltaren gel was prescribed. Stretching exercises for plantar fasciitis and Achilles tendinitis as the area does feel tight for the patient. Continue supportive shoe gear and orthotics. -Follow-up in 3 weeks or sooner if any problems arise. In the meantime, encouraged to call the office with any questions, concerns, change in symptoms.  *x-ray next  appointment   Ovid Curd, DPM

## 2015-03-22 ENCOUNTER — Ambulatory Visit: Payer: BLUE CROSS/BLUE SHIELD | Admitting: Podiatry

## 2015-05-23 ENCOUNTER — Ambulatory Visit (INDEPENDENT_AMBULATORY_CARE_PROVIDER_SITE_OTHER): Payer: BLUE CROSS/BLUE SHIELD | Admitting: Nurse Practitioner

## 2015-05-23 ENCOUNTER — Encounter: Payer: Self-pay | Admitting: Nurse Practitioner

## 2015-05-23 VITALS — HR 70 | Ht 64.0 in | Wt 243.2 lb

## 2015-05-23 DIAGNOSIS — G43109 Migraine with aura, not intractable, without status migrainosus: Secondary | ICD-10-CM

## 2015-05-23 DIAGNOSIS — G932 Benign intracranial hypertension: Secondary | ICD-10-CM

## 2015-05-23 MED ORDER — ACETAZOLAMIDE 250 MG PO TABS: 250.0000 mg | ORAL_TABLET | Freq: Two times a day (BID) | ORAL | Status: DC

## 2015-05-23 MED ORDER — RIZATRIPTAN BENZOATE 5 MG PO TBDP: 5.0000 mg | ORAL_TABLET | ORAL | Status: DC | PRN

## 2015-05-23 NOTE — Progress Notes (Signed)
[facility]  PATIENT: Lisa Stafford DOB: 1986-01-21   REASON FOR VISIT: Follow-up for pseudotumor cerebri, migraine headaches HISTORY FROM: Patient    HISTORY OF PRESENT ILLNESS:Lisa Stafford is a 29 yo RH AAF referred by her optometrist Do. 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 Has headaches 2-3 times each month,  She did not have LP, because of conflict with her schedule, she does not want to reschedule it now, she understands the potential risk of long term increased intracranial pressure can permanently damage her vision  UPDATE June 2nd 2016: She is now taking Topamax 100 mg twice a day, still notice bilateral feet numbness tingling, she is also dealing with plantar fasciitis, planning on to have left plantar fasciitis procedure soon, she works out regularly, she only has much milder degree of headaches,  reported 80% improvement, Maxalt as needed works well, but she rarely needs it, she has intermittent blurry vision, left side worse than right. UPDATE 05/23/2015 Ms [name],29 year old female returns for follow-up. She was last seen in the past by Dr. Terrace ArabiaYan , last visit 11/15/2014. She has a history of pseudotumor cerebri and migraine headaches . Her headaches generally are relieved with Maxalt. She stopped taking her Trokendi in September. She had side effects of numbness and tingling to Topamax, switched to trokendi and had extreme anxiety on the medication.She did not notify the office. She claims she saw her ophthalmologist Dr. Laural BenesJohnson 2 weeks ago and was told her eye exam was stable.She claims she has gained about 15 pounds since being off the Topamax.She has had plantar fasciitis surgery since last seen and is performing water aerobics once a week. She returns for reevaluation. She currently does not have primary care provider.  REVIEW OF SYSTEMS: Full 14 system review of systems performed and notable only for those listed, all others are neg:  Constitutional: neg  Cardiovascular: neg Ear/Nose/Throat: neg  Skin: neg Eyes: light sensitivity Respiratory: neg Gastroitestinal: neg  Hematology/Lymphatic: neg  Endocrine: excessive thirst Musculoskeletal:neg Allergy/Immunology: neg Neurological: headache Psychiatric: depression Sleep : neg   ALLERGIES: No Known Allergies  HOME MEDICATIONS: Outpatient Prescriptions Prior to Visit  Medication Sig Dispense Refill  . diclofenac sodium (VOLTAREN) 1 % GEL Apply 2 g topically 4 (four) times daily. Rub into affected area of foot 2 to 4 times daily 100 g 2  . rizatriptan (MAXALT-MLT) 5 MG disintegrating tablet Take 1 tablet (5 mg total) by mouth as needed. May repeat in 2 hours if needed 15 tablet 6  . topiramate (  TOPAMAX) 100 MG tablet Take 1 tablet (100 mg total) by mouth 2 (two) times daily. 1/2 po bid xone week, then one tab po bid (Patient not  taking: Reported on 05/23/2015) 60 tablet 11  . Topiramate ER 200 MG CP24 Take 200 mg by mouth at bedtime. (Patient not taking: Reported on 05/23/2015) 30 capsule 11   No facility-administered medications prior to visit.    PAST MEDICAL HISTORY: Past Medical History  Diagnosis Date  . Foot pain, right   . Pseudotumor cerebri   . Optic nerve edema     PAST SURGICAL HISTORY: Past Surgical History  Procedure Laterality Date  . Foot surgery Right   . Wisdom tooth extraction      x 4    FAMILY HISTORY: Family History  Problem Relation Age of Onset  . Renal Disease Mother   . Hypertension Mother   . Healthy Father   . Breast cancer Paternal Grandmother   . Prostate cancer Paternal Grandfather   . Diabetes Maternal Grandmother   . Heart attack Maternal Grandmother     SOCIAL HISTORY: Social History   Social History  . Marital Status: Single    Spouse Name: N/A  . Number of Children: 0  . Years of Education: 15   Occupational History  . [employer]    Social History Main Topics  . Smoking status: Never Smoker   . Smokeless tobacco: Not on file  . Alcohol Use: 0.0 oz/week    0 Standard drinks or equivalent per week     Comment: Socially   . Drug Use: No  . Sexual Activity: Not on file   Other Topics Concern  . Not on file   Social History Narrative   Lives at home with girlfriend.   Right-handed.   Occasional use of caffeine.     PHYSICAL EXAM  Filed Vitals:   05/23/15 0804  BP: 126/78  Pulse: 70  Height:  (1.626 m)  Weight: 243 lb 3.2 oz (110.315 kg)   Body mass index is 41.72 kg/(m^2).  Generalized: Well developed, morbidly obese female in no acute distress, well-groomed  Head: normocephalic and atraumatic,. Oropharynx benign  Neck: Supple, no carotid bruits  Cardiac: Regular rate rhythm, no murmur  Musculoskeletal: No deformity   Neurological examination   Mentation: Alert oriented to time, place, history taking. Attention  span and concentration appropriate. Recent and remote memory intact.  Follows all commands speech and language fluent.   Cranial nerve II-XII: Fundoscopic exam without evidence of papillary edema, I could not appreciate venous pulsations on either side.Pupils were equal round reactive to light extraocular movements were full, visual field were full on confrontational test. Visual acuity 20/20 right, 20/40 left. Facial sensation and strength were normal. hearing was intact to finger rubbing bilaterally. Uvula tongue midline. head turning and shoulder shrug were normal and symmetric.Tongue protrusion into cheek strength was normal. Motor: normal bulk and tone, full strength in the BUE, BLE, fine finger movements normal, no pronator drift. No focal weakness Sensory: normal and symmetric to light touch, pinprick, and  Vibration,  Coordination: finger-nose-finger, heel-to-shin bilaterally, no dysmetria Reflexes: Brachioradialis 2/2, biceps 2/2, triceps 2/2, patellar 2/2, Achilles 2/2, plantar responses were flexor bilaterally. Gait and Station: Rising up from seated position without assistance, normal stance,  moderate stride, good arm swing, smooth turning, able to perform tiptoe, and heel walking without difficulty. Tandem gait is steady  DIAGNOSTIC DATA (LABS, IMAGING, TESTING) - I reviewed patient records, labs, notes, testing  and imaging myself where available.  Lab Results  Component Value Date   WBC 4.4 08/29/2014   HGB 13.3 08/29/2014   HCT 40.5 08/29/2014   MCV 84.7 08/29/2014   PLT 262 08/29/2014      Component Value Date/Time   NA 139 08/29/2014 2010   K 3.8 08/29/2014 2010   CL 105 08/29/2014 2010   CO2 27 08/29/2014 2010   GLUCOSE 80 08/29/2014 2010   BUN 12 08/29/2014 2010   CREATININE 0.86 08/29/2014 2010   CALCIUM 9.1 08/29/2014 2010   GFRNONAA >90 08/29/2014 2010   GFRAA >90 08/29/2014 2010    ASSESSMENT AND PLAN  29 y.o. year old female  has a past medical history of  Foot pain, right; Pseudotumor cerebri; and Optic nerve edema.,  migraine headaches, here to follow up.  MRI of the brain showed an empty sellar.  PLAN:Pt stopped trokendi due to side effects of extreme anxiety. She did not call the office Will give Diamox at  BID Continue follow-up with ophthalmology Continue Maxalt for acute headache She still does not want to have lumbar puncture, will continue frequent  ophthalmology evaluation next in 1 month continue weight loss, exercise,  return to clinic in 2 months  I spent additional 12 minutes in total face to face time with the patient more than 50% of which was spent counseling and coordination of care, reviewing test results reviewing medications and discussing and reviewing the diagnosis of pseudotumor cerebri and further treatment options. Importance of staying on medication as she can go blind from the optic edema/swelling.Weight loss is a big part of treating this disorder, she needs to eat a healthy diet no in between snacks. Vst time 25 min Nilda Riggs, [facility], [facility], APRN  [facility] [address], [address] [place], [place] [address] [phone]

## 2015-05-23 NOTE — Progress Notes (Signed)
I have reviewed and agreed above plan. 

## 2015-05-23 NOTE — Patient Instructions (Signed)
Pt stopped Topamax Will give Diamox at 250mg  BID Continue Maxalt [facility] F/U in 2 months

## 2015-07-25 ENCOUNTER — Ambulatory Visit: Payer: BLUE CROSS/BLUE SHIELD | Admitting: Nurse Practitioner

## 2015-09-02 ENCOUNTER — Telehealth: Payer: Self-pay | Admitting: Neurology

## 2015-09-02 NOTE — Telephone Encounter (Signed)
Returned call to patient - she was at work when I spoke to her - reports an increase in migraines and she would like an appointment to discuss her medication.  Last seen 05/23/15 and instructed to follow up in two months.  Appt scheduled for 09/04/15.

## 2015-09-02 NOTE — Telephone Encounter (Signed)
Patient has called back and states she is in a lot of pain.  Please call.

## 2015-09-02 NOTE — Telephone Encounter (Signed)
Patient is calling in regard to Rx acetaZolamide 250 mg that she has been taking about a month. She states she is having a lot of tingling in her hands and feet, bad headaches to the point of throwing up, and states her eyes are so sensitive to light that she cannot drive which is a part of her job.  Please call her @ [phone] or work [phone].

## 2015-09-03 ENCOUNTER — Ambulatory Visit (INDEPENDENT_AMBULATORY_CARE_PROVIDER_SITE_OTHER): Payer: BLUE CROSS/BLUE SHIELD | Admitting: Nurse Practitioner

## 2015-09-03 ENCOUNTER — Encounter: Payer: Self-pay | Admitting: Nurse Practitioner

## 2015-09-03 VITALS — BP 100/76 | HR 84 | Resp 18 | Ht 64.0 in | Wt 240.6 lb

## 2015-09-03 DIAGNOSIS — G43109 Migraine with aura, not intractable, without status migrainosus: Secondary | ICD-10-CM | POA: Diagnosis not present

## 2015-09-03 DIAGNOSIS — G932 Benign intracranial hypertension: Secondary | ICD-10-CM

## 2015-09-03 NOTE — Progress Notes (Signed)
I have reviewed and agreed above plan. 

## 2015-09-03 NOTE — Progress Notes (Signed)
[facility]  PATIENT: Lisa Stafford DOB: 1985-09-16   REASON FOR VISIT: Follow-up for migraines and pseudotumor cerebri HISTORY FROM: Patient    HISTORY OF PRESENT ILLNESS:Lisa Stafford is a 30 yo RH AAF referred by her optometrist Do. 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 Laural Benes to have bilateral papillary edema, left worse than right, She was sent for MRI of the brain, August 29 2014, we have reviewed the film, No acute intracranial process, expanded fluid filled sella consistent with and the sella, MRI of orbit, no optic nerve enhancement,Patulous optic nerve sheath complexes  UPDATE October 04 2014: She is taking Topamax  qhs, Maxalt prn, which has been very helpful, vision is better. Has headaches 2-3 times each month,  She did not have LP, because of conflict with her schedule, she does not want to reschedule it now, she understands the potential risk of long term increased intracranial pressure can permanently damage her vision  UPDATE June 2nd 2016: She is now taking Topamax 100 mg twice a day, still notice bilateral feet numbness tingling, she is also dealing with plantar fasciitis, planning on to have left plantar fasciitis procedure soon, she works out regularly, she only has much milder degree of headaches, reported  80% improvement, Maxalt as needed works well, but she rarely needs it, she has intermittent blurry vision, left side worse than right. UPDATE 05/23/2015 CMMs Punches,30 year old female returns for follow-up. She was last seen in the past by Dr. Terrace Arabia , last visit 11/15/2014. She has a history of pseudotumor cerebri and migraine headaches . Her headaches generally are relieved with Maxalt. She stopped taking her Trokendi in September. She had side effects of numbness and tingling to Topamax, switched to trokendi and had extreme anxiety on the medication.She did not notify the office. She claims she saw her ophthalmologist Dr. Laural Benes 2 weeks ago and was told her eye exam was stable.She claims she has gained about 15 pounds since being off the Topamax.She has had plantar fasciitis surgery since last seen and is performing water aerobics once a week. She returns for reevaluation. She currently does not have primary care provider.  UPDATE 09/03/2015 CMMs. Lisa Stafford 30 year old female returns for follow-up. She has a history of pseudotumor cerebri and migraine headaches. She is supposed to be taking Diamox twice daily however she cut back to once daily about 1 month ago. She went to her eye doctor Dr. Laural Benes 1 month ago and her prescription glasses were changed however she has not gotten her new prescription yet. She was told eye exam is stable. She is also hypertensive and  was recently placed on Cozaar and blood pressure is well controlled. She has failed Topamax and trokendi in the past. She complains of inability to sleep at night. She returns for reevaluation    REVIEW OF SYSTEMS: Full 14 system review of systems performed and notable only for those listed, all others are neg:  Constitution  Fatigue Cardiovascular: neg Ear/Nose/Throat: neg  Skin: neg Eyes:  blurred vision Respiratory: neg Gastroitestinal: neg  Hematology/Lymphatic: neg  Endocrine: neg Musculoskeletal:neg Allergy/Immunology:  neg Neurological Headache Psychiatric: neg Sleep : neg   ALLERGIES: No Known Allergies  HOME MEDICATIONS: Outpatient Prescriptions Prior to Visit  Medication Sig Dispense Refill  . diclofenac sodium (VOLTAREN) 1 % GEL Apply 2 g topically 4 (four) times daily. Rub into affected area of foot 2 to 4 times daily 100 g 2  . rizatriptan (MAXALT-MLT) 5 MG disintegrating tablet Take 1 tablet (5 mg total) by mouth as needed. May repeat in 2 hours if needed 15 tablet 6  . acetaZOLAMIDE (DIAMOX) 250 MG tablet Take 1 tablet (250 mg total) by mouth 2 (two) times daily. 60 tablet 3   No facility-administered medications prior to visit.    PAST MEDICAL HISTORY: Past Medical History  Diagnosis Date  . Foot pain, right   . Pseudotumor cerebri   . Optic nerve edema     PAST SURGICAL HISTORY: Past Surgical History  Procedure Laterality Date  . Foot surgery Right   . Wisdom tooth extraction      x 4    FAMILY HISTORY: Family History  Problem Relation Age of Onset  . Renal Disease Mother   . Hypertension Mother   . Healthy Father   . Breast cancer Paternal Grandmother   . Prostate cancer Paternal Grandfather   . Diabetes Maternal Grandmother   . Heart attack Maternal Grandmother     SOCIAL HISTORY: Social History   Social History  . Marital Status: Single    Spouse Name: N/A  . Number of Children: 0  . Years of Education: 15   Occupational History  . [employer]    Social History Main Topics  . Smoking status: Never Smoker   . Smokeless tobacco: Not on file  . Alcohol Use: 0.0 oz/week    0 Standard drinks or equivalent per week     Comment: Socially   . Drug Use: No  . Sexual Activity: Not on file   Other Topics Concern  . Not on file   Social History Narrative   Lives at home with girlfriend.   Right-handed.   Occasional use of caffeine.     PHYSICAL EXAM  Filed Vitals:   09/03/15 1536  BP: 100/76  Pulse: 84  Resp: 18  Height:  (1.626  m)  Weight: 240 lb 9.6 oz (109.135 kg)   Body mass index is 41.28 kg/(m^2). Generalized: Well developed, morbidly obese female in no acute distress, well-groomed  Head: normocephalic and atraumatic,. Oropharynx benign  Neck: Supple, no carotid bruits  Cardiac: Regular rate rhythm, no murmur  Musculoskeletal: No deformity   Neurological examination   Mentation: Alert oriented to time, place, history taking. Attention span and concentration appropriate. Recent and remote memory intact. Follows all commands speech and language fluent.   Cranial nerve II-XII: Fundoscopic exam without evidence of papillary edema, I could not appreciate venous pulsations on either side.Pupils were equal round reactive to light extraocular movements were full, visual field were full on confrontational test. Visual acuity 20/50 right, 20/40 left. Facial sensation and strength were normal. hearing was intact to finger rubbing bilaterally. Uvula tongue midline. Head turning and shoulder shrug were normal and symmetric.Tongue protrusion into cheek strength was normal. Motor: normal bulk and tone, full strength in the BUE, BLE, fine finger movements normal, no pronator drift. No focal weakness Sensory: normal and symmetric to light touch,  pinprick, and Vibration,  Coordination: finger-nose-finger, heel-to-shin bilaterally, no dysmetria Reflexes: Brachioradialis 2/2, biceps 2/2, triceps 2/2, patellar 2/2, Achilles 2/2, plantar responses were flexor bilaterally. Gait and Station: Rising up from seated position without assistance, normal stance, moderate stride, good arm swing, smooth turning, able to perform tiptoe, and heel walking without difficulty. Tandem gait is steady DIAGNOSTIC DATA (LABS, IMAGING, TESTING) -  ASSESSMENT AND PLAN  30 y.o. year old female  has a past medical history of  Pseudotumor cerebri; and migraine headaches here to follow-up..  Increase Diamox to twice daily Try Flexeril 10mg  at  night for headache preventive If Flexeril does not work, call back for . Prednisone Dosepak Avoid triggers  Maxalt acutely F/U in 3 months Nilda RiggsNancy Carolyn Martin, [facility]ankatoGNP, [facility], APRN  [facility] [address], [address] [place], [place] [address] [phone]

## 2015-09-03 NOTE — Patient Instructions (Addendum)
Increase Diamox to twice daily Try Flexeril 10mg  at night for headache preventive Avoid triggers  Maxalt acutely F/U in 3 months

## 2015-09-04 ENCOUNTER — Ambulatory Visit: Payer: Self-pay | Admitting: Nurse Practitioner

## 2015-09-04 ENCOUNTER — Telehealth: Payer: Self-pay | Admitting: Nurse Practitioner

## 2015-09-04 MED ORDER — CYCLOBENZAPRINE HCL 10 MG PO TABS: 10.0000 mg | ORAL_TABLET | Freq: Every day | ORAL | Status: DC

## 2015-09-04 NOTE — Telephone Encounter (Signed)
Patient called to check status of cyclobenzaprine (FLEXERIL) 10 MG tablet, pharmacy advised patient they have not received Rx. Patient advised, Rx was e-scribed and we received confirmation that it was received 3/22 9:28am EDT.

## 2015-09-04 NOTE — Telephone Encounter (Signed)
Patient called to advise cyclobenzaprine (FLEXERIL) 10 MG tablet wasn't at the pharmacy when she went to pick it up last night. [facility] on [address] Please call patient if questions, cell# [phone] or wk# [phone].

## 2015-09-04 NOTE — Telephone Encounter (Signed)
Refill sent to preferred pharmacy. 

## 2015-12-05 ENCOUNTER — Ambulatory Visit: Payer: BLUE CROSS/BLUE SHIELD | Admitting: Nurse Practitioner

## 2015-12-16 ENCOUNTER — Ambulatory Visit (INDEPENDENT_AMBULATORY_CARE_PROVIDER_SITE_OTHER): Payer: BLUE CROSS/BLUE SHIELD | Admitting: Nurse Practitioner

## 2015-12-16 ENCOUNTER — Encounter: Payer: Self-pay | Admitting: Nurse Practitioner

## 2015-12-16 VITALS — BP 110/64 | HR 80 | Ht 64.0 in | Wt 232.8 lb

## 2015-12-16 DIAGNOSIS — G43109 Migraine with aura, not intractable, without status migrainosus: Secondary | ICD-10-CM | POA: Diagnosis not present

## 2015-12-16 DIAGNOSIS — G932 Benign intracranial hypertension: Secondary | ICD-10-CM | POA: Diagnosis not present

## 2015-12-16 MED ORDER — RIZATRIPTAN BENZOATE 10 MG PO TABS: 10.0000 mg | ORAL_TABLET | ORAL | Status: AC | PRN

## 2015-12-16 MED ORDER — ACETAZOLAMIDE 250 MG PO TABS: 250.0000 mg | ORAL_TABLET | Freq: Two times a day (BID) | ORAL | Status: AC

## 2015-12-16 NOTE — Patient Instructions (Signed)
Continue  Diamox  twice daily Avoid triggers  Maxalt acutely increase to 10mg  MLT F/U in 6 months

## 2015-12-16 NOTE — Progress Notes (Signed)
[facility]  PATIENT: Lisa Stafford DOB: Dec 15, 1985   REASON FOR VISIT: Follow-up for migraine, pseudotumor cerebri HISTORY FROM: Patient    HISTORY OF PRESENT ILLNESS:Lisa Stafford is a 30 yo RH AAF referred by her optometrist Do. 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 Has headaches 2-3 times each month,  She did not have LP, because of conflict with her schedule, she does not want to reschedule it now, she understands the potential risk of long term increased intracranial pressure can permanently damage her vision  UPDATE June 2nd 2016: She is now taking Topamax 100 mg twice a day, still notice bilateral feet numbness tingling, she is also dealing with plantar fasciitis, planning on to have left plantar fasciitis procedure soon, she works out regularly, she only has much milder degree of headaches, reported 80%  improvement, Maxalt as needed works well, but she rarely needs it, she has intermittent blurry vision, left side worse than right. UPDATE 05/23/2015 CMMs Lisa Stafford,30 year old female returns for follow-up. She was last seen in the past by Dr. Terrace ArabiaYan , last visit 11/15/2014. She has a history of pseudotumor cerebri and migraine headaches . Her headaches generally are relieved with Maxalt. She stopped taking her Trokendi in September. She had side effects of numbness and tingling to Topamax, switched to trokendi and had extreme anxiety on the medication.She did not notify the office. She claims she saw her ophthalmologist Dr. Laural BenesJohnson 2 weeks ago and was told her eye exam was stable.She claims she has gained about 15 pounds since being off the Topamax.She has had plantar fasciitis surgery since last seen and is performing water aerobics once a week. She returns for reevaluation. She currently does not have primary care provider.  UPDATE 09/03/2015 CMMs. Lisa Stafford 30 year old female returns for follow-up. She has a history of pseudotumor cerebri and migraine headaches. She is supposed to be taking Diamox twice daily however she cut back to once daily about 1 month ago. She went to her eye doctor Dr. Laural BenesJohnson 1 month ago and her prescription glasses were changed however she has not gotten her new prescription yet. She was told eye exam is stable. She is also hypertensive and was recently placed on Cozaar and blood pressure is well controlled. She has failed Topamax and trokendi in the past. She complains of inability to sleep at night. She returns for reevaluation UPDATE 07/03/2017CM Ms. Lisa PianoAnderson, 30 year old female returns for follow-up. She has a history of pseudotumor cerebri and migraine headaches. When  last seen she has increased her  Diamox to twice daily. She takes Maxalt acutely which works. She wears contacts and glasses and now notes that she has headaches on days when she wears her glasses. She is due to get  her glasses prescription changed . She returns for reevaluation  REVIEW OF SYSTEMS: Full 14 system review of systems performed and notable only for those listed, all others are neg:  Constitutional: Excessive sweating  Cardiovascular: neg Ear/Nose/Throat: neg  Skin: neg Eyes: Light sensitivity Respiratory: neg Gastroitestinal: neg  Hematology/Lymphatic: neg  Endocrine: neg Musculoskeletal:neg Allergy/Immunology: neg Neurological: Headaches Psychiatric: neg Sleep : neg   ALLERGIES: No Known Allergies  HOME MEDICATIONS: Outpatient Prescriptions Prior to Visit  Medication Sig Dispense Refill  . acetaZOLAMIDE (DIAMOX) 250 MG tablet Take 1 tablet (250 mg total) by mouth 2 (two) times daily. 60 tablet 3  . cyclobenzaprine (FLEXERIL) 10 MG tablet Take 1 tablet (10 mg total) by mouth at bedtime. 30 tablet 0  . diclofenac sodium (VOLTAREN) 1 % GEL Apply 2 g topically 4 (four) times daily. Rub into affected area of foot 2 to 4 times daily 100 g 2  . losartan (COZAAR) 50 MG tablet     . rizatriptan (MAXALT-MLT) 5 MG disintegrating tablet Take 1 tablet (5 mg total) by mouth as needed. May repeat in 2 hours if needed 15 tablet 6   No facility-administered medications prior to visit.    PAST MEDICAL HISTORY: Past Medical History  Diagnosis Date  . Foot pain, right   . Pseudotumor cerebri   . Optic nerve edema     PAST SURGICAL HISTORY: Past Surgical History  Procedure Laterality Date  . Foot surgery Right   . Wisdom tooth extraction      x 4    FAMILY HISTORY: Family History  Problem Relation Age of Onset  . Renal Disease Mother   . Hypertension Mother   . Healthy Father   . Breast cancer Paternal Grandmother   . Prostate cancer Paternal Grandfather   . Diabetes Maternal Grandmother   . Heart attack Maternal Grandmother     SOCIAL HISTORY: Social History   Social History  . Marital Status: Single    Spouse Name: N/A  . Number of Children: 0  . Years of  Education: 15   Occupational History  . [employer]    Social History Main Topics  . Smoking status: Never Smoker   . Smokeless tobacco: Not on file  . Alcohol Use: 0.0 oz/week    0 Standard drinks or equivalent per week     Comment: Socially   . Drug Use: No  . Sexual Activity: Not on file   Other Topics Concern  . Not on file   Social History Narrative   Lives at home with girlfriend.   Right-handed.   Occasional use of caffeine.     PHYSICAL EXAM  Filed Vitals:   12/16/15 1530  BP: 110/64  Pulse: 80  Height:  (1.626 m)  Weight: 232 lb 12.8 oz (105.597 kg)   Body mass index is 39.94 kg/(m^2). Generalized: Well developed, morbidly obese female in no acute distress, well-groomed  Head: normocephalic and atraumatic,. Oropharynx benign  Neck: Supple, no carotid bruits  Cardiac: Regular rate rhythm, no murmur  Musculoskeletal: No deformity   Neurological examination   Mentation: Alert oriented to time, place, history taking. Attention span and concentration appropriate. Recent and remote memory intact. Follows all commands speech and language fluent.   Cranial nerve II-XII: Fundoscopic exam without evidence  of papillary edema,Pupils were equal round reactive to light extraocular movements were full, visual field were full on confrontational test. Visual acuity 20/50 right, 20/70 left without correction. Facial sensation and strength were normal. hearing was intact to finger rubbing bilaterally. Uvula tongue midline. Head turning and shoulder shrug were normal and symmetric.Tongue protrusion into cheek strength was normal. Motor: normal bulk and tone, full strength in the BUE, BLE, fine finger movements normal, no pronator drift. No focal weakness Sensory: normal and symmetric to light touch, pinprick, and Vibration,  Coordination: finger-nose-finger, heel-to-shin bilaterally, no dysmetria Reflexes: Brachioradialis 2/2, biceps 2/2, triceps 2/2,  patellar 2/2, Achilles 2/2, plantar responses were flexor bilaterally. Gait and Station: Rising up from seated position without assistance, normal stance, moderate stride, good arm swing, smooth turning, able to perform tiptoe, and heel walking without difficulty. Tandem gait is steady  DIAGNOSTIC DATA (LABS, IMAGING, TESTING)   ASSESSMENT AND PLAN 30 y.o. year old female has a past medical history of Pseudotumor cerebri; and migraine headaches here to follow-up..  Continue  Diamox  twice daily will refill Avoid triggers  Maxalt acutely increase to 10mg  MLT F/U in 6 months Nilda RiggsNancy Carolyn Martin, [facility] HospitalGNP, [facility], APRN  [facility] [address], [address] [place], [place] [address] [phone]

## 2016-09-19 IMAGING — MR MR HEAD WO/W CM
12 of 17 series · 22 of 48 positions shown · IV contrast (Yes   MH)
Comparison: None.

CLINICAL DATA: Headache and LEFT eye pain for 3 months,
intermittent and not severe. Intermittent blurry vision. Optometrist
noted LEFT optic nerve swelling.

EXAM:
MRI HEAD AND ORBITS WITHOUT AND WITH CONTRAST
TECHNIQUE: Multiplanar, multiecho pulse sequences of the brain and surrounding
structures were obtained without and with intravenous contrast.
Multiplanar, multiecho pulse sequences of the orbits and surrounding
structures were obtained including fat saturation techniques, before
and after intravenous contrast administration.
CONTRAST:  15mL MULTIHANCE GADOBENATE DIMEGLUMINE 529 MG/ML IV SOLN

[Series 2: FLAIR · sagittal · 5.0mm · 0.47mm/px · 1 of 23 slices shown (1 of 2)]
[im 1/23]
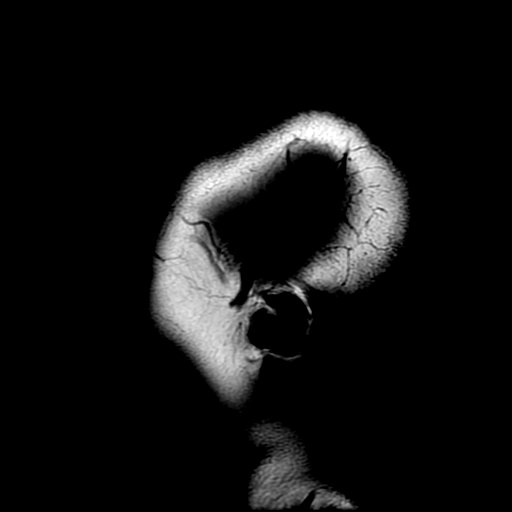

[Series 4: DWI · axial · 3.6mm · 1.09mm/px · z∈[-83,+65]mm · 4 of 86 slices shown (1 of 2)]
[im 1/86]
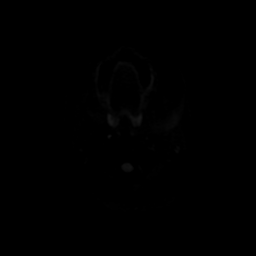
[im 29/86]
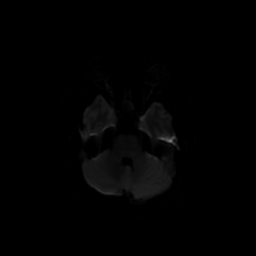
[im 57/86]
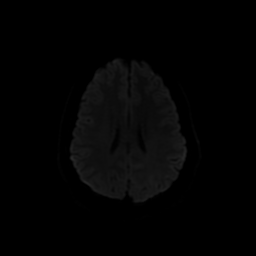
[im 86/86]
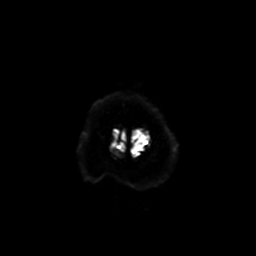

[Series 5: T2 · axial · 5.0mm · 0.43mm/px · 1 of 26 slices shown (1 of 2)]
[im 1/26]
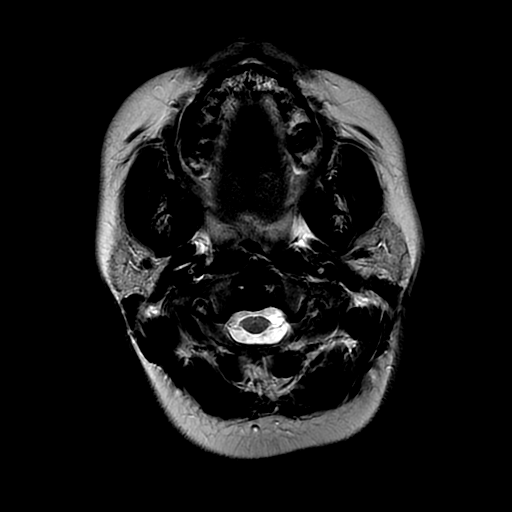

[Series 6: FLAIR · axial · 5.0mm · 0.43mm/px · 1 of 26 slices shown (2 of 2)]
[im 1/26]
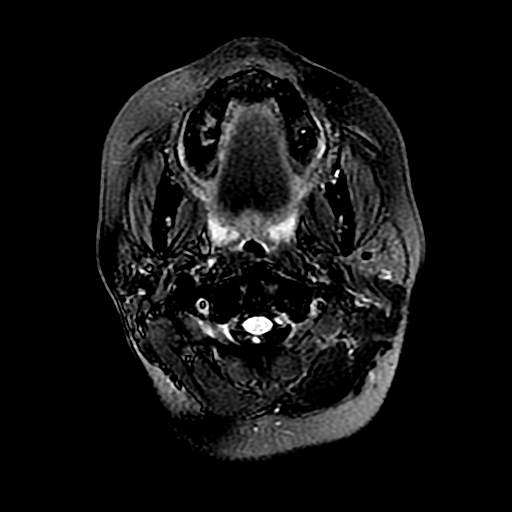

[Series 9: T2 · coronal · 5.0mm · 0.47mm/px · 2 of 33 slices shown (2 of 2)]
[im 1/33]
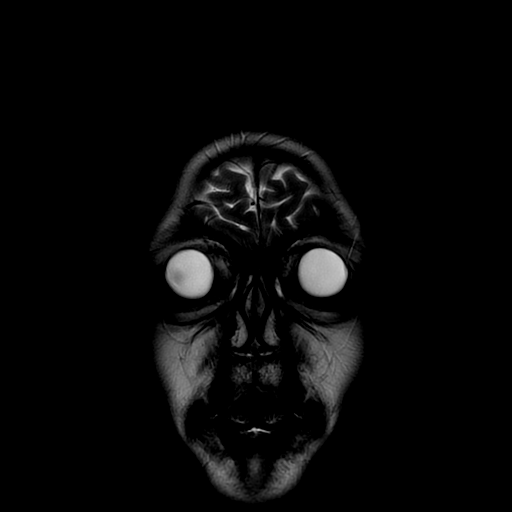
[im 33/33]
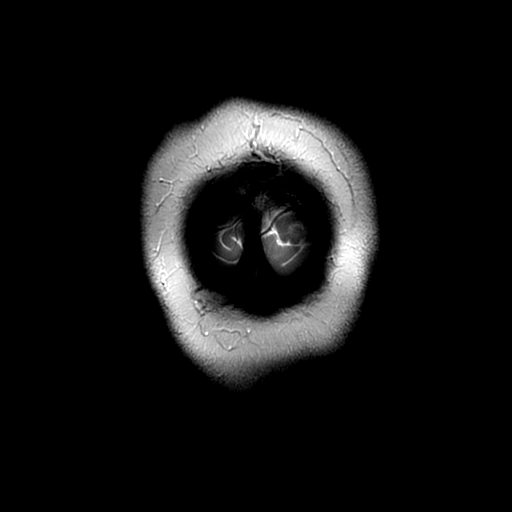

[Series 11: T2 fat-sat · axial · 3.0mm · 0.35mm/px · z∈[-78,-1]mm · 2 of 29 slices shown (1 of 2)]
[im 1/29]
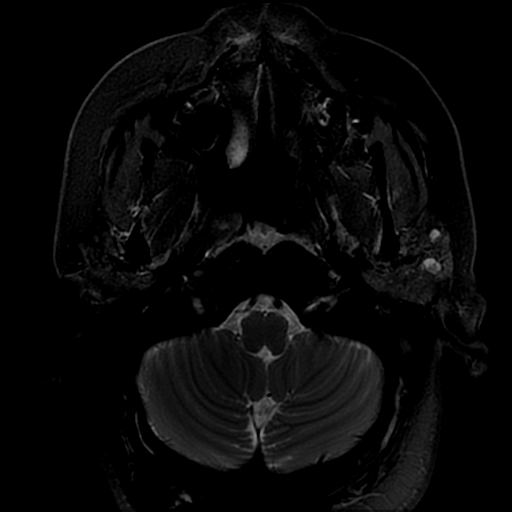
[im 29/29]
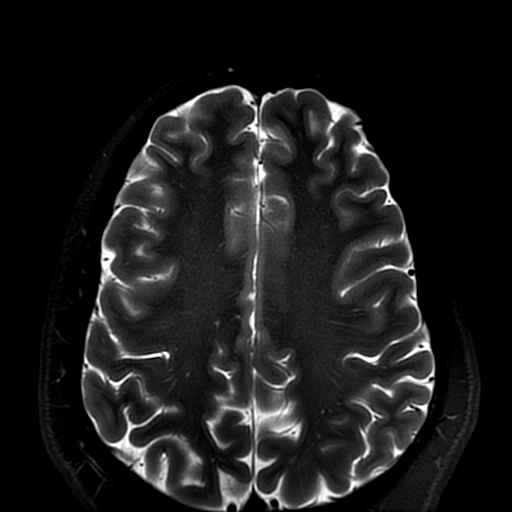

[Series 12: T1 · coronal · 3.0mm · 0.35mm/px · 2 of 29 slices shown (1 of 3)]
[im 1/29]
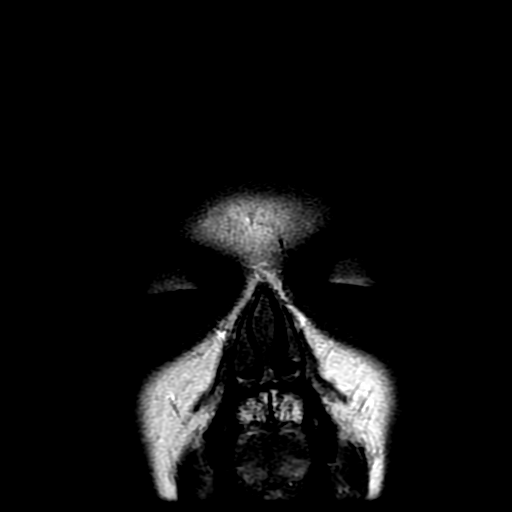
[im 29/29]
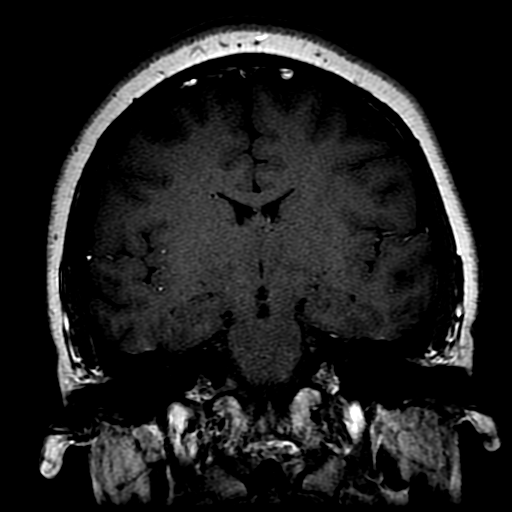

[Series 13: T1 · axial · 3.0mm · 0.35mm/px · z∈[-78,-1]mm · 2 of 29 slices shown (2 of 3)]
[im 1/29]
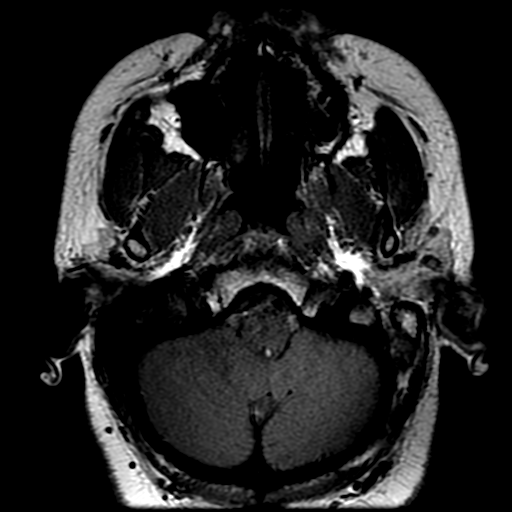
[im 29/29]
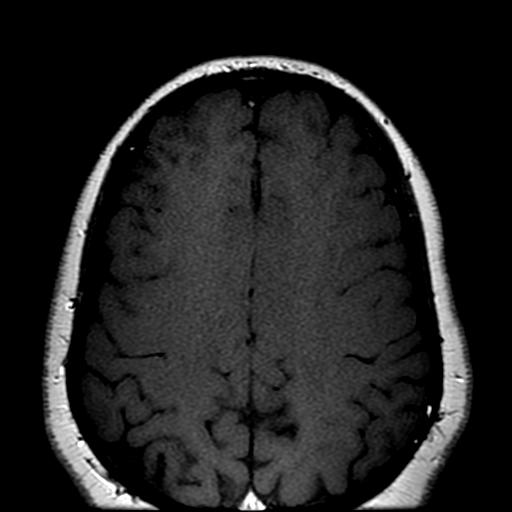

[Series 14: T2 fat-sat · coronal · 3.0mm · 0.35mm/px · 2 of 29 slices shown (2 of 2)]
[im 1/29]
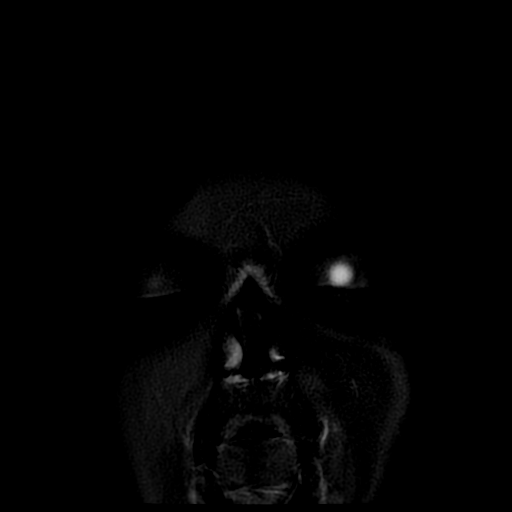
[im 29/29]
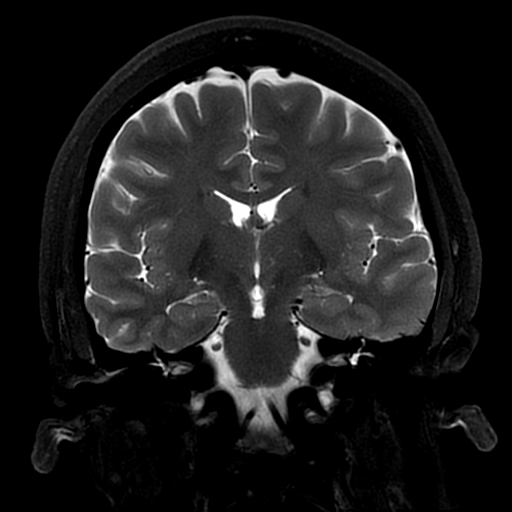

[Series 17: T1 · coronal · 5.0mm · 0.47mm/px · 2 of 33 slices shown (3 of 3)]
[im 1/33]
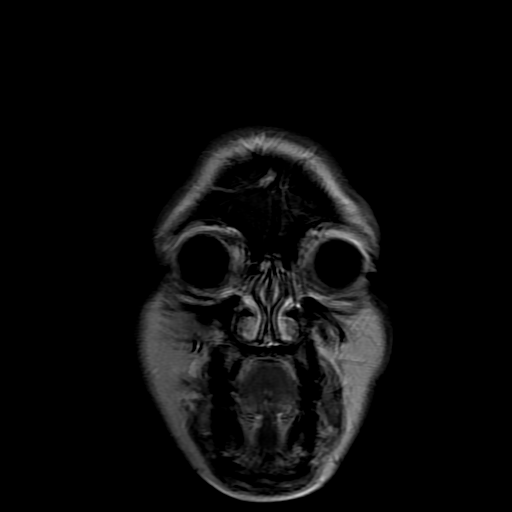
[im 33/33]
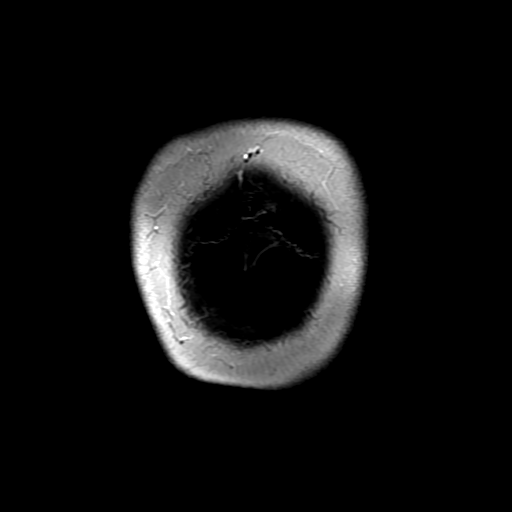

[Series 18: T1 fat-sat · coronal · 3.0mm · 0.35mm/px · 1 of 29 slices shown]
[im 1/29]
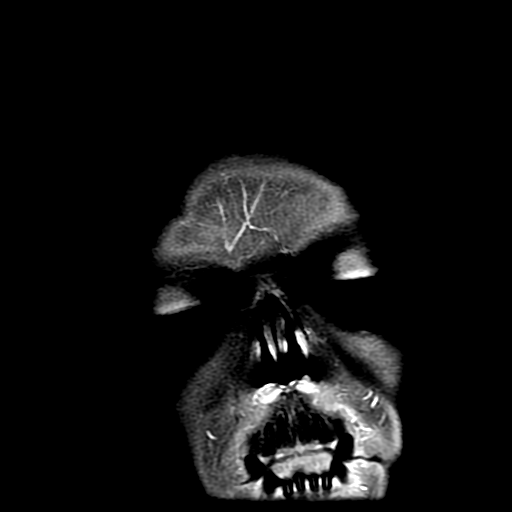

[Series 400: DWI · axial · 3.6mm · 1.09mm/px · z∈[-83,+65]mm · 2 of 43 slices shown (2 of 2)]
[im 1/43]
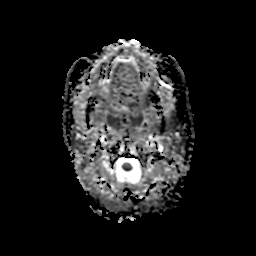
[im 43/43]
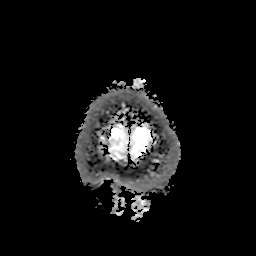

[22 of 48 positions shown; findings below may reference images not displayed]

FINDINGS: MRI HEAD FINDINGS

The ventricles and sulci are normal for patient's age. No abnormal
parenchymal signal, mass lesions, mass effect. No abnormal
parenchymal enhancement. A few tiny T2 hyperintensities in the
bifrontal subcortical white matter, nonspecific. No reduced
diffusion to suggest acute ischemia nor hyperacute demyelination. No
susceptibility artifact to suggest hemorrhage.

No abnormal extra-axial fluid collections. No extra-axial masses nor
leptomeningeal enhancement. Normal major intracranial vascular flow
voids seen at the skull base.

Mildly expanded sella with somewhat flattened superior margin of the
pituitary gland. Pituitary infundibulum is new nondisplaced
Visualized paranasal sinuses and mastoid air cells are well-aerated.
No suspicious calvarial bone marrow signal. Craniocervical junction
maintained.

MRI ORBITS FINDINGS

Ocular globes intact, lenses are located. Redundant, somewhat
patulous nerve sleep complexes, LEFT greater the RIGHT though motion
degrades sensitivity. No convincing evidence of abnormal optic nerve
signal though mild motion degrades sensitivity. No optic nerve
enhancement.

Extraocular muscles located, normal morphology and signal
characteristics. Preservation of the orbital fat. Superior
ophthalmic veins are not enlarged.

No suspicious calvarial bone marrow signal. Paranasal sinuses are
well aerated.
IMPRESSION: MRI BRAIN: No acute intracranial process, no MR findings of
demyelination. Mild nonspecific white matter changes.

Expanded fluid filled sella consistent with empty sella, which is
associated with intracranial hypertension.

MRI ORBITS: Patulous optic nerve sheath complexes, which is
associated with idiopathic intracranial hypertension. No abnormal
optic nerve enhancement.

  By: Quirijn Amazigh
